# Patient Record
Sex: Female | Born: 1990 | Race: White | Hispanic: No | Marital: Married | Smoking: Former smoker
Health system: Southern US, Community
[De-identification: ages and names within clinical notes are randomized; demographics above are authoritative.]

## PROBLEM LIST (undated history)

## (undated) ENCOUNTER — Inpatient Hospital Stay: Payer: Self-pay

## (undated) DIAGNOSIS — R519 Headache, unspecified: Secondary | ICD-10-CM

## (undated) DIAGNOSIS — N946 Dysmenorrhea, unspecified: Secondary | ICD-10-CM

## (undated) DIAGNOSIS — F410 Panic disorder [episodic paroxysmal anxiety] without agoraphobia: Secondary | ICD-10-CM

## (undated) DIAGNOSIS — R51 Headache: Secondary | ICD-10-CM

## (undated) DIAGNOSIS — N926 Irregular menstruation, unspecified: Secondary | ICD-10-CM

## (undated) DIAGNOSIS — N6452 Nipple discharge: Secondary | ICD-10-CM

## (undated) DIAGNOSIS — L0591 Pilonidal cyst without abscess: Secondary | ICD-10-CM

## (undated) HISTORY — PX: TYMPANOSTOMY TUBE PLACEMENT: SHX32

## (undated) HISTORY — PX: PILONIDAL CYST EXCISION: SHX744

## (undated) HISTORY — DX: Nipple discharge: N64.52

## (undated) HISTORY — DX: Pilonidal cyst without abscess: L05.91

## (undated) HISTORY — DX: Irregular menstruation, unspecified: N92.6

## (undated) HISTORY — DX: Dysmenorrhea, unspecified: N94.6

---
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
## 2010-03-02 ENCOUNTER — Emergency Department: Payer: Self-pay | Admitting: Internal Medicine

## 2010-05-04 ENCOUNTER — Encounter: Payer: Self-pay | Admitting: Maternal and Fetal Medicine

## 2010-07-13 ENCOUNTER — Encounter: Payer: Self-pay | Admitting: Maternal & Fetal Medicine

## 2010-08-16 ENCOUNTER — Observation Stay: Payer: Self-pay | Admitting: Obstetrics and Gynecology

## 2010-09-02 ENCOUNTER — Observation Stay: Payer: Self-pay | Admitting: Obstetrics and Gynecology

## 2010-09-11 ENCOUNTER — Observation Stay: Payer: Self-pay | Admitting: Obstetrics and Gynecology

## 2010-09-21 ENCOUNTER — Observation Stay: Payer: Self-pay | Admitting: Obstetrics and Gynecology

## 2010-09-25 ENCOUNTER — Inpatient Hospital Stay: Payer: Self-pay | Admitting: Obstetrics and Gynecology

## 2011-01-01 ENCOUNTER — Emergency Department: Payer: Self-pay | Admitting: Emergency Medicine

## 2012-07-16 LAB — HM PAP SMEAR: HM Pap smear: NEGATIVE

## 2014-04-12 ENCOUNTER — Ambulatory Visit: Payer: Self-pay | Admitting: Obstetrics and Gynecology

## 2014-06-29 ENCOUNTER — Ambulatory Visit (INDEPENDENT_AMBULATORY_CARE_PROVIDER_SITE_OTHER): Payer: Managed Care, Other (non HMO) | Admitting: Obstetrics and Gynecology

## 2014-06-29 ENCOUNTER — Encounter: Payer: Self-pay | Admitting: Obstetrics and Gynecology

## 2014-06-29 VITALS — BP 102/70 | HR 67 | Ht 64.0 in | Wt 134.0 lb

## 2014-06-29 DIAGNOSIS — N898 Other specified noninflammatory disorders of vagina: Secondary | ICD-10-CM | POA: Diagnosis not present

## 2014-06-29 DIAGNOSIS — N644 Mastodynia: Secondary | ICD-10-CM

## 2014-06-29 MED ORDER — AZITHROMYCIN 500 MG PO TABS: 1000.0000 mg | ORAL_TABLET | Freq: Once | ORAL | Status: DC

## 2014-06-29 NOTE — Progress Notes (Signed)
Patient ID: Amanda Allen, female   DOB: 1990-02-06, 24 y.o.   MRN: [id number] SUBJECTIVE Pt here today for follow up on lump in left breast. Pt states that she has had some issues with her IUD. Pt states that when she first got her IUD she had no periods and for the last 3 months she has had heavy periods that last about a week. Pt states that she is also having pain with intercourse.   The patient has had her IUD for approximately 4 years.  Her cycles have become heavy and very irregular over the past 3 months.  Deep thrusting dyspareunia is noted.  Patient does not have perimenstrual low back ache.  She does not have a family history of endometriosis.  The patient's breast tenderness is left sided.  There is asymmetry of the breasts.  She has noted that her symptoms have been correlated with her IUD use.  Ultrasound in radiology was negative for abnormal findings.  OBJECTIVE Well-appearing white female in no acute distress. Neck supple without thyromegaly or adenopathy. Breasts notable for asymmetry with left larger than right; diffuse fibrocystic changes in the lateral quadrants of left breast are appreciated; left breast is 1/4 tender; no nipple discharge. Abdomen soft, nontender, without organomegaly. Pelvic: External genitalia normal BUS: Normal. Vagina: White yellow discharge; wet prep notable for moderate white blood cells, otherwise negative; KOH prep is negative. Cervix: Parous, without lesions; mild cervical motion tenderness present. Uterus: Midplane, normal size and shape, mildly tender. Rectal: External exam normal. Skin: No rash. Neuro: Normal.  ASSESSMENT: 1.  Mastodynia, uncertain etiology. 2.  Vaginal discharge, uncertain etiology, possibly related to IUD.  Plan: 1.  Zithromax 1 g by mouth 1 dose. 2.  Monitor symptoms.  Limit caffeine and chocolate intake. 3.  Discuss with husband.  Alternative forms of contraception with plan on removing the IUD in near  future.

## 2014-06-29 NOTE — Patient Instructions (Addendum)
1.  Zithromax 1 g by mouth 1 dose. 2.  Monitor symptoms.  Limit caffeine and chocolate intake. 3.  Discuss with husband.  Alternative forms of contraception with plan on removing the IUD in near future. 4.  Patient will follow up in several weeks for further management planning.

## 2014-07-01 ENCOUNTER — Ambulatory Visit: Payer: Self-pay | Admitting: Obstetrics and Gynecology

## 2014-07-06 ENCOUNTER — Ambulatory Visit (INDEPENDENT_AMBULATORY_CARE_PROVIDER_SITE_OTHER): Payer: Managed Care, Other (non HMO) | Admitting: Obstetrics and Gynecology

## 2014-07-06 ENCOUNTER — Encounter: Payer: Self-pay | Admitting: Obstetrics and Gynecology

## 2014-07-06 ENCOUNTER — Telehealth: Payer: Self-pay | Admitting: Obstetrics and Gynecology

## 2014-07-06 VITALS — BP 115/74 | HR 82 | Ht 64.0 in | Wt 132.1 lb

## 2014-07-06 DIAGNOSIS — N898 Other specified noninflammatory disorders of vagina: Secondary | ICD-10-CM

## 2014-07-06 DIAGNOSIS — N644 Mastodynia: Secondary | ICD-10-CM

## 2014-07-06 DIAGNOSIS — Z30011 Encounter for initial prescription of contraceptive pills: Secondary | ICD-10-CM

## 2014-07-06 DIAGNOSIS — Z30431 Encounter for routine checking of intrauterine contraceptive device: Secondary | ICD-10-CM

## 2014-07-06 MED ORDER — NORETHINDRONE ACET-ETHINYL EST 1-20 MG-MCG PO TABS: 1.0000 | ORAL_TABLET | Freq: Every day | ORAL | Status: DC

## 2014-07-06 NOTE — Telephone Encounter (Signed)
Has a question about Birth control rx this morning

## 2014-07-06 NOTE — Progress Notes (Signed)
GYN ENCOUNTER NOTE  Subjective:       Amanda Allen is a 24 y.o. No obstetric history on file. female is here for gynecologic evaluation of the following issues:  1. IUD removal. 2.  OCP start. 3.  Vaginal discharge. 4.  Mastodynia   Gynecologic History Patient's last menstrual period was 05/31/2014 (exact date). Contraception: IUD   Obstetric History OB History  No data available    Past Medical History  Diagnosis Date  . Pilonidal cyst   . Irregular menses   . Nipple discharge in female     occasional  . Dysmenorrhea     Past Surgical History  Procedure Laterality Date  . Pilonidal cyst excision      No current outpatient prescriptions on file prior to visit.   No current facility-administered medications on file prior to visit.    No Known Allergies  History   Social History  . Marital Status: Single    Spouse Name: N/A  . Number of Children: N/A  . Years of Education: N/A   Occupational History  . Not on file.   Social History Main Topics  . Smoking status: Current Every Day Smoker -- 0.25 packs/day for 2 years    Types: Cigarettes  . Smokeless tobacco: Never Used  . Alcohol Use: No  . Drug Use: No  . Sexual Activity: Yes    Birth Control/ Protection: IUD   Other Topics Concern  . Not on file   Social History Narrative    Family History  Problem Relation Age of Onset  . Cancer Paternal Grandmother     pgm-sister  . Hyperlipidemia Mother   . Cancer Maternal Grandfather     prostate    The following portions of the patient's history were reviewed and updated as appropriate: allergies, current medications, past family history, past medical history, past social history, past surgical history and problem list.  Review of Systems Review of Systems - Breast ROS: negative for - new or changing breast lumps, nipple discharge or mastodynia Review of Systems - General ROS: negative for - chills, fatigue, fever, hot flashes, malaise or  night sweats Hematological and Lymphatic ROS: negative for - bleeding problems or swollen lymph nodes Gastrointestinal ROS: negative for - abdominal pain, blood in stools, change in bowel habits and nausea/vomiting Musculoskeletal ROS: negative for - joint pain, muscle pain or muscular weakness Genito-Urinary ROS: negative for - change in menstrual cycle, dysmenorrhea, dyspareunia, dysuria, genital discharge, genital ulcers, hematuria, incontinence, irregular/heavy menses, nocturia or pelvic pain  Objective:   BP 115/74 mmHg  Pulse 82  Ht  (1.626 m)  Wt 132 lb 1 oz (59.903 kg)  BMI 22.66 kg/m2  LMP 05/31/2014 (Exact Date) CONSTITUTIONAL: Well-developed, well-nourished female in no acute distress.  HENT:  Normocephalic, atraumatic.  NECK: Normal range of motion, supple, no masses.  Normal thyroid.  SKIN: Skin is warm and dry. No rash noted. Not diaphoretic. No erythema. No pallor. NEUROLGIC: Alert and oriented to person, place, and time. PSYCHIATRIC: Normal mood and affect. Normal behavior. Normal judgment and thought content. CARDIOVASCULAR:Not Examined RESPIRATORY: Not Examined BREASTS: Not Examined ABDOMEN: Soft, non distended; Non tender.  No Organomegaly. PELVIC:  External Genitalia: Normal  BUS: Normal  Vagina: Normal  Cervix: Normal; IUD removed  RV: Normal   Bladder: Nontender      Assessment:  1.  Contraception-Mirena IUD removed 2.  Patient desires starting birth control pills. 3.  Vaginal discharge, resolved, status post treatment with Zithromax 1  g by mouth 4.  Breast tenderness-resolved  Plan:  1.  Mirena IUD removed. 2.  Use backup method of contraception for 4 weeks. 3.  Start Microgestin Fe 1/20 OCPs. 4.  RTC in 3 months for her annual exam.

## 2014-07-06 NOTE — Telephone Encounter (Signed)
Pt picked up her ocp which was only 21 day pack. Advised she will take for 21 days then have her period for the next 7 days. After 7 days she will start her new pack.

## 2014-07-06 NOTE — Patient Instructions (Addendum)
1.  Use backup birth Control for 2 weeks. 2.  Start Microgestin OCPs. 3.  RTC for annual exam in 3 months.

## 2014-07-27 ENCOUNTER — Encounter: Payer: Self-pay | Admitting: Obstetrics and Gynecology

## 2014-08-23 ENCOUNTER — Telehealth: Payer: Self-pay | Admitting: Obstetrics and Gynecology

## 2014-08-23 NOTE — Telephone Encounter (Signed)
Just started [facility] June 14 and had a normal period on the first month. Then she started again before she finished her pills. She finished her last pill last night and needs to know if she should start up her next pack or go the week off like narmal.

## 2014-08-24 NOTE — Telephone Encounter (Signed)
Pt advised to take bcp as prescribed and to expect to have irregular menses, vaginal spotting from 3-6 months. The ocp's should regulate her cycles.

## 2014-09-20 ENCOUNTER — Telehealth: Payer: Self-pay | Admitting: Obstetrics and Gynecology

## 2014-09-20 NOTE — Telephone Encounter (Signed)
ON Amanda Allen AND SHE IS HAVING IRREGULAR PERIODS, NEG PREG TEST, BREAST TENDERNESS, FATIGUE, NAUSEA, AND SHE HAS HAD AN INCREASE IN SALIVA, AND SHE WANTED TO KNOW WHAT SHE NEEDS TO DO, SHE THINKS SHE IS ON HER PERIOD NOW AND SHE SAYS ITS REALLY PAINFUL. PT WOULD LIKE A CALL BACK ON WHAT SHE SHOULD DO. SHE STATED SHE WAS IN THE FETAL POSITION LAST NIGHT DUE TO THE PAIN. PT DOES HAVE AN APPT COMING UP IN A COUPLE OF WEEKS, SHE WANTED TO KNOW IF THERE WAS ANYTHING TOMORROW I TOLD HER NO, BUT I WOULD SEND THIS TO YOU FOR YOU TO MAKE THE CALL ON IF SHE NEEDS TO COME IN.

## 2014-09-21 NOTE — Telephone Encounter (Signed)
Pt has been on junel 1/20 since June- She is having irregular cycles. Really bad cramps the last 3 days. 2 neg upt. Taking pills same time qd. Advised it may take up to 6 months to regulate cycle. Her bleeding was heavy at times. She is taking Pamprin and asa with slight relief from cramps. Advised to take 800 ibup q 8. If bleeding gets heavy soaking a pad q 30 min she will need to be seen prior to her 2 week f/u.

## 2014-10-06 ENCOUNTER — Ambulatory Visit (INDEPENDENT_AMBULATORY_CARE_PROVIDER_SITE_OTHER): Payer: Managed Care, Other (non HMO) | Admitting: Obstetrics and Gynecology

## 2014-10-06 ENCOUNTER — Encounter: Payer: Self-pay | Admitting: Obstetrics and Gynecology

## 2014-10-06 VITALS — BP 108/74 | HR 105 | Ht 64.0 in | Wt 133.3 lb

## 2014-10-06 DIAGNOSIS — N946 Dysmenorrhea, unspecified: Secondary | ICD-10-CM | POA: Diagnosis not present

## 2014-10-06 DIAGNOSIS — N941 Dyspareunia: Secondary | ICD-10-CM | POA: Diagnosis not present

## 2014-10-06 DIAGNOSIS — N939 Abnormal uterine and vaginal bleeding, unspecified: Secondary | ICD-10-CM | POA: Diagnosis not present

## 2014-10-06 DIAGNOSIS — Z01419 Encounter for gynecological examination (general) (routine) without abnormal findings: Secondary | ICD-10-CM | POA: Diagnosis not present

## 2014-10-06 DIAGNOSIS — IMO0002 Reserved for concepts with insufficient information to code with codable children: Secondary | ICD-10-CM

## 2014-10-06 DIAGNOSIS — Z720 Tobacco use: Secondary | ICD-10-CM

## 2014-10-06 MED ORDER — NORETHIN ACE-ETH ESTRAD-FE 1.5-30 MG-MCG PO TABS: 1.0000 | ORAL_TABLET | Freq: Every day | ORAL | Status: DC

## 2014-10-06 NOTE — Addendum Note (Signed)
Addended by: Marchelle Folks on: 10/06/2014 11:47 AM   Modules accepted: Orders

## 2014-10-06 NOTE — Patient Instructions (Addendum)
1.  Pap smear not done because of menses. 2.  Return in 3 months for Pap smear and follow-up on cycles. 3.  Change birth control pill from Junel1/20 to  JuneL 1.5/30. 4.  Take Advil 800 mg 3 times a day for Aleve 2 tablets twice a day for severe cramps. 5.  May take Tylenol as needed as well to help with cramps. 6.  Consider taking Wellbutrin for smoking cessation.  Call us if you would like to start the medication after talking to her mother,, and checking for insurance coverage. 7.  Return in one year for annual exam.

## 2014-10-06 NOTE — Progress Notes (Signed)
Patient ID: Amanda Allen, female   DOB: [birth year], 24 y.o.   MRN: [id number] [facility]NU[facility] GYN  ENCOUNTER NOTE  Subjective:       Amanda Allen is a 24 y.o. No obstetric history on file. female here for a routine annual gynecologic exam.  Current complaints: 1.  Irregular bleeding on junel 1/20 - cramps and fatigued; over past couple months leading has been heavy and associated with severe cramps; no intermenstrual bleeding. 2.  New onset deep thrusting dyspareunia. 3.  Tobacco user-interested in quitting   Gynecologic History Patient's last menstrual period was 10/03/2014. Contraception: OCP (estrogen/progesterone) Last Pap: 07/21/2013 neg. Results were: normal Last mammogram: n.a. Results were: n/a History of Mirena IUD use, not tolerated due to breast changes, which resolved after discontinuation.  Obstetric History Para 1001  Past Medical History  Diagnosis Date  . Pilonidal cyst   . Irregular menses   . Nipple discharge in female     occasional  . Dysmenorrhea     Past Surgical History  Procedure Laterality Date  . Pilonidal cyst excision      Current Outpatient Prescriptions on File Prior to Visit  Medication Sig Dispense Refill  . norethindrone-ethinyl estradiol (MICROGESTIN) 1-20 MG-MCG tablet Take 1 tablet by mouth daily. 1 Package 11   No current facility-administered medications on file prior to visit.    No Known Allergies  Social History   Social History  . Marital Status: Single    Spouse Name: N/A  . Number of Children: N/A  . Years of Education: N/A   Occupational History  . Not on file.   Social History Main Topics  . Smoking status: Current Every Day Smoker -- 0.25 packs/day for 2 years    Types: Cigarettes  . Smokeless tobacco: Never Used  . Alcohol Use: No  . Drug Use: No  . Sexual Activity: Yes    Birth Control/ Protection: Pill   Other Topics Concern  . Not on file   Social History Narrative    Family  History  Problem Relation Age of Onset  . Cancer Paternal Grandmother     pgm-sister  . Hyperlipidemia Mother   . Cancer Maternal Grandfather     prostate    The following portions of the patient's history were reviewed and updated as appropriate: allergies, current medications, past family history, past medical history, past social history, past surgical history and problem list.  Review of Systems ROS Review of Systems - General ROS: negative for - chills, fatigue, fever, hot flashes, night sweats, weight gain or weight loss Psychological ROS: negative for - anxiety, decreased libido, depression, mood swings, physical abuse or sexual abuse Ophthalmic ROS: negative for - blurry vision, eye pain or loss of vision ENT ROS: negative for - headaches, hearing change, visual changes or vocal changes Allergy and Immunology ROS: negative for - hives, itchy/watery eyes or seasonal allergies Hematological and Lymphatic ROS: negative for - bleeding problems, bruising, swollen lymph nodes or weight loss Endocrine ROS: negative for - galactorrhea, hair pattern changes, hot flashes, malaise/lethargy, mood swings, palpitations, polydipsia/polyuria, skin changes, temperature intolerance or unexpected weight changes Breast ROS: negative for - new or changing breast lumps or nipple discharge Respiratory ROS: negative for - cough or shortness of breath Cardiovascular ROS: negative for - chest pain, irregular heartbeat, palpitations or shortness of breath Gastrointestinal ROS: no abdominal pain, change in bowel habits, or black or bloody stools Genito-Urinary ROS: no dysuria, trouble voiding, or hematuria Musculoskeletal ROS: negative  for - joint pain or joint stiffness Neurological ROS: negative for - bowel and bladder control changes Dermatological ROS: negative for rash and skin lesion changes   Objective:   BP 108/74 mmHg  Pulse 105  Ht  (1.626 m)  Wt 133 lb 4.8 oz (60.464 kg)  BMI 22.87  kg/m2  LMP 10/03/2014 CONSTITUTIONAL: Well-developed, well-nourished female in no acute distress.  PSYCHIATRIC: Normal mood and affect. Normal behavior. Normal judgment and thought content. NEUROLGIC: Alert and oriented to person, place, and time. Normal muscle tone coordination. No cranial nerve deficit noted. HENT:  Normocephalic, atraumatic, External right and left ear normal. Oropharynx is clear and moist EYES: Conjunctivae and EOM are normal. Pupils are equal, round, and reactive to light. No scleral icterus.  NECK: Normal range of motion, supple, no masses.  Normal thyroid.  SKIN: Skin is warm and dry. No rash noted. Not diaphoretic. No erythema. No pallor. CARDIOVASCULAR: Normal heart rate noted, regular rhythm, no murmur. RESPIRATORY: Clear to auscultation bilaterally. Effort and breath sounds normal, no problems with respiration noted. BREASTS: Symmetric in size. No masses, skin changes, nipple drainage, or lymphadenopathy. ABDOMEN: Soft, normal bowel sounds, no distention noted.  No tenderness, rebound or guarding.  BLADDER: Normal PELVIC:  External Genitalia: Normal  BUS: Normal  Vagina: Normal; menstrual blood in the vault, too much to do Pap smear  Cervix: Normal; mild cervical motion tenderness  Uterus: Normal; retroverted, mobile, 1/4 tender, not enlarged  Adnexa: Normal  RV: External Exam NormaI  MUSCULOSKELETAL: Normal range of motion. No tenderness.  No cyanosis, clubbing, or edema.  2+ distal pulses. LYMPHATIC: No Axillary, Supraclavicular, or Inguinal Adenopathy.    Assessment:   Annual gynecologic examination 24 y.o. Contraception: OCP (estrogen/progesterone) Normal BMI Abnormal uterine bleeding on birth control pills (20 g pill). New-onset deep thrusting dyspareunia. Tobacco user-desiring to quit  Plan:  Pap: pap w reflex ct/ng(Will do at follow-up visit in 3 months) Mammogram: Not Indicated Stool Guaiac Testing:  Not Indicated Labs: all normal last year  will repeat 2017 Routine preventative health maintenance measures emphasized: Exercise/Diet/Weight control, Tobacco Warnings and Alcohol/Substance use risks Will think about Wellbutrin use for smoking cessation and will contact us if she desires medication. Return in 3 months for Pap smear and follow-up on oral contraceptives for control of dysmenorrhea and menorrhagia. Change from 20 g to 30 g OCP (Junel 1.5/30) Return to Clinic - [address] [place], [place]  Herold Harms, MD

## 2014-11-29 ENCOUNTER — Ambulatory Visit (INDEPENDENT_AMBULATORY_CARE_PROVIDER_SITE_OTHER): Payer: Managed Care, Other (non HMO) | Admitting: Family Medicine

## 2014-11-29 ENCOUNTER — Encounter: Payer: Self-pay | Admitting: Family Medicine

## 2014-11-29 VITALS — BP 100/64 | HR 78 | Ht 65.0 in | Wt 133.0 lb

## 2014-11-29 DIAGNOSIS — F17210 Nicotine dependence, cigarettes, uncomplicated: Secondary | ICD-10-CM

## 2014-11-29 DIAGNOSIS — Z7189 Other specified counseling: Secondary | ICD-10-CM | POA: Diagnosis not present

## 2014-11-29 DIAGNOSIS — H6502 Acute serous otitis media, left ear: Secondary | ICD-10-CM | POA: Diagnosis not present

## 2014-11-29 DIAGNOSIS — J40 Bronchitis, not specified as acute or chronic: Secondary | ICD-10-CM | POA: Diagnosis not present

## 2014-11-29 DIAGNOSIS — Z7689 Persons encountering health services in other specified circumstances: Secondary | ICD-10-CM

## 2014-11-29 MED ORDER — ALBUTEROL SULFATE HFA 108 (90 BASE) MCG/ACT IN AERS: 2.0000 | INHALATION_SPRAY | Freq: Four times a day (QID) | RESPIRATORY_TRACT | Status: DC | PRN

## 2014-11-29 MED ORDER — AMOXICILLIN 500 MG PO CAPS: 500.0000 mg | ORAL_CAPSULE | Freq: Three times a day (TID) | ORAL | Status: DC

## 2014-11-29 MED ORDER — GUAIFENESIN-CODEINE 100-10 MG/5ML PO SOLN: 5.0000 mL | Freq: Three times a day (TID) | ORAL | Status: DC | PRN

## 2014-11-29 NOTE — Progress Notes (Signed)
Name: Amanda Allen   MRN: [id number]    DOB: [birth year]   Date:11/29/2014       Progress Note  Subjective  Chief Complaint  Chief Complaint  Patient presents with  . Bronchitis    cong, SOB, wheezing, bilateral ear pain    Cough This is a new problem. The current episode started in the past 7 days. The problem has been waxing and waning. The problem occurs every few minutes. The cough is productive of purulent sputum. Associated symptoms include ear congestion, ear pain, a fever, headaches, nasal congestion, postnasal drip, rhinorrhea, shortness of breath, sweats and wheezing. Pertinent negatives include no chest pain, chills, heartburn, hemoptysis, myalgias, rash, sore throat or weight loss. The symptoms are aggravated by cold air and pollens. Risk factors for lung disease include smoking/tobacco exposure. She has tried prescription cough suppressant for the symptoms. The treatment provided no relief. There is no history of asthma, bronchitis, COPD or environmental allergies.    No problem-specific assessment & plan notes found for this encounter.   Past Medical History  Diagnosis Date  . Pilonidal cyst   . Irregular menses   . Nipple discharge in female     occasional  . Dysmenorrhea     Past Surgical History  Procedure Laterality Date  . Pilonidal cyst excision      Family History  Problem Relation Age of Onset  . Cancer Paternal Grandmother     pgm-sister  . Hyperlipidemia Mother   . Cancer Maternal Grandfather     prostate    Social History   Social History  . Marital Status: Single    Spouse Name: N/A  . Number of Children: N/A  . Years of Education: N/A   Occupational History  . Not on file.   Social History Main Topics  . Smoking status: Current Every Day Smoker -- 0.25 packs/day for 2 years    Types: Cigarettes  . Smokeless tobacco: Never Used  . Alcohol Use: No  . Drug Use: No  . Sexual Activity: Yes    Birth Control/ Protection: Pill    Other Topics Concern  . Not on file   Social History Narrative    No Known Allergies   Review of Systems  Constitutional: Positive for fever. Negative for chills, weight loss and malaise/fatigue.  HENT: Positive for ear pain, postnasal drip and rhinorrhea. Negative for ear discharge and sore throat.   Eyes: Negative for blurred vision.  Respiratory: Positive for cough, shortness of breath and wheezing. Negative for hemoptysis and sputum production.   Cardiovascular: Negative for chest pain, palpitations and leg swelling.  Gastrointestinal: Negative for heartburn, nausea, abdominal pain, diarrhea, constipation, blood in stool and melena.  Genitourinary: Negative for dysuria, urgency, frequency and hematuria.  Musculoskeletal: Negative for myalgias, back pain, joint pain and neck pain.  Skin: Negative for rash.  Neurological: Positive for headaches. Negative for dizziness, tingling, sensory change and focal weakness.  Endo/Heme/Allergies: Negative for environmental allergies and polydipsia. Does not bruise/bleed easily.  Psychiatric/Behavioral: Negative for depression and suicidal ideas. The patient is not nervous/anxious and does not have insomnia.      Objective  Filed Vitals:   11/29/14 1143  BP: 100/64  Pulse: 78  Height: 5\' 5"  (1.651 m)  Weight: 133 lb (60.328 kg)  SpO2: 99%    Physical Exam  Constitutional: She is well-developed, well-nourished, and in no distress. No distress.  HENT:  Head: Normocephalic and atraumatic.  Right Ear: External ear normal. Tympanic membrane is  retracted.  Left Ear: External ear normal. A middle ear effusion is present.  Nose: Nose normal.  Mouth/Throat: Oropharynx is clear and moist.  Eyes: Conjunctivae and EOM are normal. Pupils are equal, round, and reactive to light. Right eye exhibits no discharge. Left eye exhibits no discharge.  Neck: Normal range of motion. Neck supple. No JVD present. No thyromegaly present.  Cardiovascular:  Normal rate, regular rhythm, normal heart sounds and intact distal pulses.  Exam reveals no gallop and no friction rub.   No murmur heard. Pulmonary/Chest: Effort normal and breath sounds normal.  Abdominal: Soft. Bowel sounds are normal. She exhibits no mass. There is no tenderness. There is no guarding.  Musculoskeletal: Normal range of motion. She exhibits no edema.  Lymphadenopathy:    She has no cervical adenopathy.  Neurological: She is alert. She has normal reflexes.  Skin: Skin is warm and dry. She is not diaphoretic.  Psychiatric: Mood and affect normal.      Assessment & Plan  Problem List Items Addressed This Visit    None    Visit Diagnoses    Bronchitis    -  Primary    Relevant Medications    amoxicillin (AMOXIL) 500 MG capsule    albuterol (PROVENTIL HFA;VENTOLIN HFA) 108 (90 BASE) MCG/ACT inhaler    guaiFENesin-codeine 100-10 MG/5ML syrup    Cigarette nicotine dependence without complication        Relevant Medications    amoxicillin (AMOXIL) 500 MG capsule    albuterol (PROVENTIL HFA;VENTOLIN HFA) 108 (90 BASE) MCG/ACT inhaler    guaiFENesin-codeine 100-10 MG/5ML syrup    Acute serous otitis media of left ear, recurrence not specified        Relevant Medications    amoxicillin (AMOXIL) 500 MG capsule         Dr. Hay[facility]  11/29/2014

## 2014-11-29 NOTE — Patient Instructions (Signed)
Smoking: Ways to Quit   Know why you want to quit.   When you quit smoking, your body gets to work repairing damaged tissues. Here are some of the health benefits:   You stop the destruction of your lungs.  Your lungs are better able to fight colds, and other respiratory infections.  You decrease your risk of cancer, heart disease, strokes, and circulation problems.   In addition, when you quit you will:   Feel more in control of your life.  Have better smelling hair, breath, clothes, home, and car.  Have more stamina for activities.  Save money.  Protect your family and friends from the dangers of secondhand smoke.   Smoking is an addictive habit. Most former smokers make several attempts to quit before they finally succeed. So, never say, "I can't." Just keep trying.   Set a quit date.   Set a date for when you will stop smoking. Don't buy cigarettes to carry you beyond your last day. Tell your family and friends you plan to quit, and ask for their support and encouragement. Ask them not to offer you cigarettes.  Make a plan.   5 Days Before Your Quit Date   Think about your reasons for quitting.  Tell your friends and family you are planning to quit.  Stop buying cigarettes.   4 Days Before Your Quit Date   Pay attention to when and why you smoke.  Think of other things to hold in your hand instead of a cigarette.  Think of habits or routines to change.   3 Days Before Your Quit Date   Plan what you will do with the extra money when you stop buying cigarettes.  Think of whom you can reach out to when you need help.   2 Days Before Your Quit Date   Consider buying nonprescription nicotine patches or nicotine gum. Or see your health care provider to get a prescription for the nicotine inhaler, nasal spray, or other medicine that can help.     1 Day Before Your Quit Date   Put away lighters and ashtrays.  Throw away all cigarettes and matches - no emergency stashes  are allowed!  Clean your clothes to get rid of the smell of cigarette smoke.   Quit Day   Keep very busy.  Remind family and friends that this is your quit day.  Stay away from alcohol.  Stay away from places where you used to smoke and people you used to smoke with.  Give yourself a treat or do something else special.   Commit to staying quit.   Make sure that all your cigarettes and ashtrays are thrown away.   If you keep cigarettes or ashtrays around, sooner or later you'll break down and smoke one, then another, then another, and so on. Throw them away. Make it hard to start again.   Because you are used to having something in your mouth, you may want to chew gum as a substitute for smoking. Or munch on carrots or celery.   Spend time with nonsmokers rather than with smokers.   Think of yourself as a nonsmoker. Tell other people that you are a nonsmoker (for example, in restaurants). Stay away from places where there are a lot of smokers, such as bars. Avoid spending time with smokers. You can't tell others not to smoke, but you don't have to sit with them while they do. Plan on walking away from cigarette smoke. Spend time   with nonsmokers and sit in the nonsmoking section of restaurants.   Be prepared for relapse or difficult situations.   Most people who go back to smoking cigarettes do so within the first 3 months after quitting. Many people try 5 or more times before they successfully quit. Avoid drinking alcohol, because it lowers your chances of success. Don't be distracted by the weight you may gain after quitting. Smokers usually do not gain more than 10 pounds when they stop smoking. Learn new ways to improve your mood and overcome depression.   Start an exercise program.   As you become more fit, you will not want the nicotine effects in your body. Regular exercise will also help keep you from gaining weight.   Keep your hands busy.   You may not know what to do with  your hands for a while. Try reading, knitting, needlework, pottery, drawing, making a plastic model, or doing a jigsaw puzzle. Join special interest groups that keep you involved in your hobby.      Take on new activities.   Change your routine. Take on new activities that don't include smoking. Join an exercise group and work out regularly. Sign up for an evening class or a join a study group at your place of worship. Go on more outings with your family or friends. Learn ways to relax and manage stress.   Join a quit-smoking program if it helps.   Some people do better in groups, or with a set of instructions to follow. That's fine, too. Remember, the goal is to quit smoking. It doesn't matter how you do it.   Consider using nicotine replacement therapy.   Nicotine is the drug that is in tobacco. You can use nicotine patches or gum, available without a prescription at your local pharmacy, to help you quit smoking. Quitting smoking is a two-step process. It includes breaking the physical addiction to nicotine and breaking the smoking habit. Nicotine replacement helps take care of the nicotine addiction so that you can focus on breaking the habit.   Your health care provider can prescribe nicotine substitutes that can almost double your chances of quitting for good. They are:   Zyban (bupropion HCL)  nicotine inhaler  nicotine lozenge  nicotine nasal spray  nicotine patch.   None of these treatments is a miracle cure. Quitting can be hard work, but you can learn to live without cigarettes in your daily life.  

## 2015-01-05 ENCOUNTER — Encounter: Payer: Self-pay | Admitting: Obstetrics and Gynecology

## 2015-01-05 ENCOUNTER — Ambulatory Visit (INDEPENDENT_AMBULATORY_CARE_PROVIDER_SITE_OTHER): Payer: Managed Care, Other (non HMO) | Admitting: Obstetrics and Gynecology

## 2015-01-05 VITALS — BP 109/71 | HR 77 | Ht 64.0 in | Wt 137.8 lb

## 2015-01-05 DIAGNOSIS — Z113 Encounter for screening for infections with a predominantly sexual mode of transmission: Secondary | ICD-10-CM

## 2015-01-05 DIAGNOSIS — N939 Abnormal uterine and vaginal bleeding, unspecified: Secondary | ICD-10-CM | POA: Diagnosis not present

## 2015-01-05 DIAGNOSIS — N946 Dysmenorrhea, unspecified: Secondary | ICD-10-CM | POA: Diagnosis not present

## 2015-01-05 DIAGNOSIS — Z124 Encounter for screening for malignant neoplasm of cervix: Secondary | ICD-10-CM | POA: Diagnosis not present

## 2015-01-05 NOTE — Progress Notes (Signed)
Patient ID: Amanda SproutCarly Ann Konzelmann, female   DOB: 1990/05/08, 24 y.o.   MRN: [id number] 3 month f/u on painful heavy periods junel 1.5/30 - not helping Needs pap  Chief complaint: 1.  Abnormal uterine bleeding. 2.  Dysmenorrhea. 3.  Pap smear  Patient presents for Pap smear testing and follow-up on switch in oral contraceptives.   ABNORMAL UTERINE BLEEDING:Patient was experiencing abnormal uterine bleeding on Junel 1/20 OCP.  Over the past 3 months she has been taking Junel 1.5/30 OCP; abnormal uterine bleeding persists.  She has not skipped any of the pills.  She is taking the pills at the same time every day. DYSMENORRHEA: Patient continues to have significantly painful menses as well as discomfort with intercourse.  Past medical history, Past surgical history, medications, allergies, are reviewed.  Review of systems: Per HPI.  OBJECTIVE: BP 109/71 mmHg  Pulse 77  Ht 5\' 4"  (1.626 m)  Wt 137 lb 12.8 oz (62.506 kg)  BMI 23.64 kg/m2  LMP 12/29/2014 (Exact Date) General white female in no acute distress. Abdomen: Soft, nontender, without organomegaly. Pelvic exam: External genitalia-normal BUS-normal Vagina-normal. Cervix-parous; 1/4.  Cervical motion tenderness. Uterus-midplane to retroverted, mobile, 1/4 tender. Adnexa-nonpalpable, nontender. Rectovaginal-normal.  External exam  ASSESSMENT: 1.  Abnormal uterine bleeding on OCPs, despite change to a higher dosage estrogen containing pill. 2.  Dysmenorrhea, persists 3.  Pap smear is completed today.  PLAN: 1.  Continue with Junel 1.5/30 OCPs. 2.  Scheduled for laparoscopy with peritoneal biopsies to rule out endometriosis.. 3.  Pap smear. 4.  Return for preop appointment  A total of 15 minutes were spent face-to-face with the patient during this encounter and over half of that time dealt with counseling and coordination of care.  Herold HarmsMartin A [place], MD  Note: This dictation was prepared with Dragon dictation along with  smaller phrase technology. Any transcriptional errors that result from this process are unintentional.

## 2015-01-05 NOTE — Patient Instructions (Signed)
1.  Pap smear is done today. 2.  Laparoscopy with peritoneal biopsies to rule out endometriosis is scheduled today. 3.  Return for preop appointment prior to surgery. 4.  Continue with Junel 1.5/30 OCPs

## 2015-01-09 LAB — PAP IG, CT-NG, RFX HPV ASCU: PAP SMEAR COMMENT: 0

## 2015-01-20 ENCOUNTER — Other Ambulatory Visit: Payer: Self-pay

## 2015-01-26 ENCOUNTER — Encounter: Payer: Self-pay | Admitting: Obstetrics and Gynecology

## 2015-01-26 ENCOUNTER — Ambulatory Visit (INDEPENDENT_AMBULATORY_CARE_PROVIDER_SITE_OTHER): Payer: Managed Care, Other (non HMO) | Admitting: Obstetrics and Gynecology

## 2015-01-26 VITALS — BP 98/61 | HR 86 | Ht 64.0 in | Wt 135.8 lb

## 2015-01-26 DIAGNOSIS — Z01818 Encounter for other preprocedural examination: Secondary | ICD-10-CM

## 2015-01-26 DIAGNOSIS — R102 Pelvic and perineal pain: Secondary | ICD-10-CM

## 2015-01-26 DIAGNOSIS — N946 Dysmenorrhea, unspecified: Secondary | ICD-10-CM

## 2015-01-26 DIAGNOSIS — N941 Unspecified dyspareunia: Secondary | ICD-10-CM

## 2015-01-26 NOTE — H&P (Signed)
Subjective: HISTORY AND PHYSICAL-PREOPERATIVE    Patient is a 25 y.o. Para 1001 .female scheduled for Laparoscopy with peritoneal biopsies on 01/31/2015. Indications for procedure are Dysmenorrhea and pelvic pain(Deep thrusting dyspareunia).    Pertinent Gynecological History: Menses: Heavy, regular Contraception: Junel 1.5/30 Long history of dysmenorrhea and menorrhagia suboptimally controlled on birth control pills   Menstrual History: Menarche age: Not applicable  Patient's last menstrual period was 12/29/2014 (exact date).    Past Medical History  Diagnosis Date  . Pilonidal cyst   . Irregular menses   . Nipple discharge in female     occasional  . Dysmenorrhea     Past Surgical History  Procedure Laterality Date  . Pilonidal cyst excision     Past OB history: Para 1001  Social History   Social History  . Marital Status: Single    Spouse Name: N/A  . Number of Children: N/A  . Years of Education: N/A   Social History Main Topics  . Smoking status: Current Every Day Smoker -- 0.25 packs/day for 2 years    Types: Cigarettes  . Smokeless tobacco: Never Used  . Alcohol Use: No  . Drug Use: No  . Sexual Activity: Yes    Birth Control/ Protection: Pill   Other Topics Concern  . None   Social History Narrative    Family History  Problem Relation Age of Onset  . Cancer Paternal Grandmother     pgm-sister  . Hyperlipidemia Mother   . Cancer Maternal Grandfather     prostate     (Not in a hospital admission)  No Known Allergies  Review of Systems Constitutional: No recent fever/chills/sweats Respiratory: No recent cough/bronchitis Cardiovascular: No chest pain Gastrointestinal: No recent nausea/vomiting/diarrhea Genitourinary: No UTI symptoms Hematologic/lymphatic:No history of coagulopathy or recent blood thinner use    Objective:    BP 98/61 mmHg  Pulse 86  Ht 5\' 4"  (1.626 m)  Wt 135 lb 12.8 oz (61.598 kg)  BMI 23.30 kg/m2  LMP 12/29/2014  (Exact Date)  General:   Normal  Skin:   normal  HEENT:  Normal  Neck:  Supple without Adenopathy or Thyromegaly  Lungs:   Heart:              Breasts:   Abdomen:  Pelvis:  M/S   Extremeties:  Neuro:    clear to auscultation bilaterally   Normal without murmur   Not Examined   soft, non-tender; bowel sounds normal; no masses,  no organomegaly   Pelvic exam: External genitalia-normal BUS-normal Vagina-normal. Cervix-parous; 1/4. Cervical motion tenderness. Uterus-midplane to retroverted, mobile, 1/4 tender. Adnexa-nonpalpable, nontender. Rectovaginal-normal. External exam   No CVAT  Warm/Dry   Normal          Assessment:    1.  Dysmenorrhea. 2.  Pelvic pain, refractory to OCP use   Plan:  Laparoscopy with peritoneal biopsies to rule out endometriosis  Preoperative counseling: The patient is to undergo laparoscopy with peritoneal biopsies on 01/31/2015.  She is understanding of the planned procedure and is aware of and is accepting of all surgical risks which include but are not limited to, leading, infection, pelvic organ injury with need for repair, blood clot disorders, anesthesia risks, etc.  All questions have been answered.  Informed consent is given.  Patient is ready and willing to proceed with surgery as scheduled.  Herold HarmsMartin A Defrancesco, MD  Note: This dictation was prepared with Dragon dictation along with smaller phrase technology. Any transcriptional errors that result from  this process are unintentional.

## 2015-01-26 NOTE — Progress Notes (Signed)
Subjective:    Patient is a 25 y.o. Para 1001 .female scheduled for Laparoscopy with peritoneal biopsies on 01/31/2015. Indications for procedure are Dysmenorrhea and pelvic pain(Deep thrusting dyspareunia).    Pertinent Gynecological History: Menses: Heavy, regular Contraception: Junel 1.5/30 Long history of dysmenorrhea and menorrhagia suboptimally controlled on birth control pills   Menstrual History: Menarche age: Not applicable  Patient's last menstrual period was 12/29/2014 (exact date).    Past Medical History  Diagnosis Date  . Pilonidal cyst   . Irregular menses   . Nipple discharge in female     occasional  . Dysmenorrhea     Past Surgical History  Procedure Laterality Date  . Pilonidal cyst excision     Past OB history: Para 1001  Social History   Social History  . Marital Status: Single    Spouse Name: N/A  . Number of Children: N/A  . Years of Education: N/A   Social History Main Topics  . Smoking status: Current Every Day Smoker -- 0.25 packs/day for 2 years    Types: Cigarettes  . Smokeless tobacco: Never Used  . Alcohol Use: No  . Drug Use: No  . Sexual Activity: Yes    Birth Control/ Protection: Pill   Other Topics Concern  . None   Social History Narrative    Family History  Problem Relation Age of Onset  . Cancer Paternal Grandmother     pgm-sister  . Hyperlipidemia Mother   . Cancer Maternal Grandfather     prostate     (Not in a hospital admission)  No Known Allergies  Review of Systems Constitutional: No recent fever/chills/sweats Respiratory: No recent cough/bronchitis Cardiovascular: No chest pain Gastrointestinal: No recent nausea/vomiting/diarrhea Genitourinary: No UTI symptoms Hematologic/lymphatic:No history of coagulopathy or recent blood thinner use    Objective:    BP 98/61 mmHg  Pulse 86  Ht 5\' 4"  (1.626 m)  Wt 135 lb 12.8 oz (61.598 kg)  BMI 23.30 kg/m2  LMP 12/29/2014 (Exact Date)  General:   Normal   Skin:   normal  HEENT:  Normal  Neck:  Supple without Adenopathy or Thyromegaly  Lungs:   Heart:              Breasts:   Abdomen:  Pelvis:  M/S   Extremeties:  Neuro:    clear to auscultation bilaterally   Normal without murmur   Not Examined   soft, non-tender; bowel sounds normal; no masses,  no organomegaly   Pelvic exam: External genitalia-normal BUS-normal Vagina-normal. Cervix-parous; 1/4. Cervical motion tenderness. Uterus-midplane to retroverted, mobile, 1/4 tender. Adnexa-nonpalpable, nontender. Rectovaginal-normal. External exam   No CVAT  Warm/Dry   Normal          Assessment:    1.  Dysmenorrhea. 2.  Pelvic pain, refractory to OCP use   Plan:  Laparoscopy with peritoneal biopsies to rule out endometriosis  Preoperative counseling: The patient is to undergo laparoscopy with peritoneal biopsies on 01/31/2015.  She is understanding of the planned procedure and is aware of and is accepting of all surgical risks which include but are not limited to, leading, infection, pelvic organ injury with need for repair, blood clot disorders, anesthesia risks, etc.  All questions have been answered.  Informed consent is given.  Patient is ready and willing to proceed with surgery as scheduled.  Herold HarmsMartin A Davius [place]deau, MD  Note: This dictation was prepared with Dragon dictation along with smaller phrase technology. Any transcriptional errors that result from this process are  unintentional.

## 2015-01-27 ENCOUNTER — Encounter: Payer: Self-pay | Admitting: *Deleted

## 2015-01-27 ENCOUNTER — Encounter
Admission: RE | Admit: 2015-01-27 | Discharge: 2015-01-27 | Disposition: A | Discharge status: Home / Self Care | Payer: Managed Care, Other (non HMO) | Source: Ambulatory Visit | Attending: Obstetrics and Gynecology | Admitting: Obstetrics and Gynecology

## 2015-01-27 DIAGNOSIS — R102 Pelvic and perineal pain: Secondary | ICD-10-CM | POA: Diagnosis not present

## 2015-01-27 DIAGNOSIS — Z01818 Encounter for other preprocedural examination: Secondary | ICD-10-CM | POA: Diagnosis present

## 2015-01-27 DIAGNOSIS — N939 Abnormal uterine and vaginal bleeding, unspecified: Secondary | ICD-10-CM | POA: Insufficient documentation

## 2015-01-27 LAB — CBC WITH DIFFERENTIAL/PLATELET
BASOS PCT: 1 %
Basophils Absolute: 0 10*3/uL (ref 0–0.1)
EOS ABS: 0.2 10*3/uL (ref 0–0.7)
EOS PCT: 3 %
HCT: 41.4 % (ref 35.0–47.0)
Hemoglobin: 13.9 g/dL (ref 12.0–16.0)
Lymphocytes Relative: 37 %
Lymphs Abs: 2.1 10*3/uL (ref 1.0–3.6)
MCH: 30.2 pg (ref 26.0–34.0)
MCHC: 33.5 g/dL (ref 32.0–36.0)
MCV: 90 fL (ref 80.0–100.0)
MONO ABS: 0.4 10*3/uL (ref 0.2–0.9)
MONOS PCT: 7 %
NEUTROS PCT: 52 %
Neutro Abs: 3 10*3/uL (ref 1.4–6.5)
PLATELETS: 205 10*3/uL (ref 150–440)
RBC: 4.6 MIL/uL (ref 3.80–5.20)
RDW: 12.6 % (ref 11.5–14.5)
WBC: 5.8 10*3/uL (ref 3.6–11.0)

## 2015-01-27 LAB — RAPID HIV SCREEN (HIV 1/2 AB+AG)
HIV 1/2 ANTIBODIES: NONREACTIVE
HIV-1 P24 ANTIGEN - HIV24: NONREACTIVE

## 2015-01-27 NOTE — Patient Instructions (Signed)
  Your procedure is scheduled on: 01-31-15 (MONDAY) Report to [facility] 2ND FLOOR To find out your arrival time please call [phone] between 1PM - 3PM on 01-28-15 (FRIDAY)  Remember: Instructions that are not followed completely may result in serious medical risk, up to and including death, or upon the discretion of your surgeon and anesthesiologist your surgery may need to be rescheduled.    _X___ 1. Do not eat food or drink liquids after midnight. No gum chewing or hard candies.     _X___ 2. No Alcohol for 24 hours before or after surgery.   ____ 3. Bring all medications with you on the day of surgery if instructed.    _X___ 4. Notify your doctor if there is any change in your medical condition     (cold, fever, infections).     Do not wear jewelry, make-up, hairpins, clips or nail polish.  Do not wear lotions, powders, or perfumes. You may wear deodorant.  Do not shave 48 hours prior to surgery. Men may shave face and neck.  Do not bring valuables to the hospital.    [facility] any belongings or valuables.               Contacts, dentures or bridgework may not be worn into surgery.  Leave your suitcase in the car. After surgery it may be brought to your room.  For patients admitted to the hospital, discharge time is determined by your treatment team.   Patients discharged the day of surgery will not be allowed to drive home.   Please read over the following fact sheets that you were given:      ____ Take these medicines the morning of surgery with A SIP OF WATER:    1. NONE  2.   3.   4.  5.  6.  ____ Fleet Enema (as directed)   _X___ Use CHG Soap as directed  ____ Use inhalers on the day of surgery  ____ Stop metformin 2 days prior to surgery    ____ Take 1/2 of usual insulin dose the night before surgery and none on the morning of surgery.   ____ Stop Coumadin/Plavix/aspirin-N/A  ____ Stop Anti-inflammatories-NO  NSAIDS OR ASPIRIN PRODUCTS-TYLENOL OK   ____ Stop supplements until after surgery.    ____ Bring C-Pap to the hospital.

## 2015-01-28 LAB — RPR: RPR Ser Ql: NONREACTIVE

## 2015-01-31 ENCOUNTER — Ambulatory Visit: Payer: Managed Care, Other (non HMO) | Admitting: Anesthesiology

## 2015-01-31 ENCOUNTER — Ambulatory Visit
Admission: RE | Admit: 2015-01-31 | Discharge: 2015-01-31 | Disposition: A | Discharge status: Home / Self Care | Payer: Managed Care, Other (non HMO) | Source: Ambulatory Visit | Attending: Obstetrics and Gynecology | Admitting: Obstetrics and Gynecology

## 2015-01-31 ENCOUNTER — Encounter: Payer: Self-pay | Admitting: Anesthesiology

## 2015-01-31 ENCOUNTER — Encounter
Admission: RE | Disposition: A | Discharge status: Home / Self Care | Payer: Self-pay | Source: Ambulatory Visit | Attending: Obstetrics and Gynecology

## 2015-01-31 DIAGNOSIS — G8929 Other chronic pain: Secondary | ICD-10-CM | POA: Diagnosis present

## 2015-01-31 DIAGNOSIS — N949 Unspecified condition associated with female genital organs and menstrual cycle: Secondary | ICD-10-CM | POA: Diagnosis not present

## 2015-01-31 DIAGNOSIS — R102 Pelvic and perineal pain: Secondary | ICD-10-CM

## 2015-01-31 DIAGNOSIS — N8311 Corpus luteum cyst of right ovary: Secondary | ICD-10-CM | POA: Insufficient documentation

## 2015-01-31 DIAGNOSIS — N736 Female pelvic peritoneal adhesions (postinfective): Secondary | ICD-10-CM | POA: Diagnosis not present

## 2015-01-31 DIAGNOSIS — F17210 Nicotine dependence, cigarettes, uncomplicated: Secondary | ICD-10-CM | POA: Diagnosis not present

## 2015-01-31 DIAGNOSIS — N939 Abnormal uterine and vaginal bleeding, unspecified: Secondary | ICD-10-CM | POA: Diagnosis not present

## 2015-01-31 DIAGNOSIS — N946 Dysmenorrhea, unspecified: Secondary | ICD-10-CM | POA: Insufficient documentation

## 2015-01-31 DIAGNOSIS — N941 Unspecified dyspareunia: Secondary | ICD-10-CM

## 2015-01-31 DIAGNOSIS — N839 Noninflammatory disorder of ovary, fallopian tube and broad ligament, unspecified: Secondary | ICD-10-CM | POA: Diagnosis not present

## 2015-01-31 HISTORY — DX: Headache, unspecified: R51.9

## 2015-01-31 HISTORY — PX: LAPAROSCOPY: SHX197

## 2015-01-31 HISTORY — DX: Headache: R51

## 2015-01-31 HISTORY — DX: Panic disorder (episodic paroxysmal anxiety): F41.0

## 2015-01-31 LAB — POCT PREGNANCY, URINE: Preg Test, Ur: NEGATIVE

## 2015-01-31 SURGERY — LAPAROSCOPY, DIAGNOSTIC
Anesthesia: General | Wound class: Clean Contaminated

## 2015-01-31 MED ORDER — FENTANYL CITRATE (PF) 100 MCG/2ML IJ SOLN
INTRAMUSCULAR | Status: DC | PRN
  Administered 2015-01-31 (×4): 50 ug via INTRAVENOUS

## 2015-01-31 MED ORDER — DEXAMETHASONE SODIUM PHOSPHATE 10 MG/ML IJ SOLN
INTRAMUSCULAR | Status: DC | PRN
  Administered 2015-01-31: 10 mg via INTRAVENOUS

## 2015-01-31 MED ORDER — ONDANSETRON HCL 4 MG/2ML IJ SOLN
INTRAMUSCULAR | Status: DC | PRN
  Administered 2015-01-31: 4 mg via INTRAVENOUS

## 2015-01-31 MED ORDER — MIDAZOLAM HCL 2 MG/2ML IJ SOLN
INTRAMUSCULAR | Status: DC | PRN
  Administered 2015-01-31: 2 mg via INTRAVENOUS

## 2015-01-31 MED ORDER — FAMOTIDINE 20 MG PO TABS
20.0000 mg | ORAL_TABLET | Freq: Once | ORAL | Status: AC
  Administered 2015-01-31: 20 mg via ORAL

## 2015-01-31 MED ORDER — ONDANSETRON HCL 4 MG/2ML IJ SOLN: 4.0000 mg | Freq: Once | INTRAMUSCULAR | Status: DC | PRN

## 2015-01-31 MED ORDER — FENTANYL CITRATE (PF) 100 MCG/2ML IJ SOLN
25.0000 ug | INTRAMUSCULAR | Status: DC | PRN
  Administered 2015-01-31 (×4): 25 ug via INTRAVENOUS

## 2015-01-31 MED ORDER — OXYCODONE-ACETAMINOPHEN 5-325 MG PO TABS
ORAL_TABLET | ORAL | Status: AC
  Filled 2015-01-31: qty 1

## 2015-01-31 MED ORDER — LIDOCAINE HCL (CARDIAC) 20 MG/ML IV SOLN
INTRAVENOUS | Status: DC | PRN
  Administered 2015-01-31: 100 mg via INTRAVENOUS

## 2015-01-31 MED ORDER — LACTATED RINGERS IV SOLN
INTRAVENOUS | Status: DC
  Administered 2015-01-31: 10:00:00 via INTRAVENOUS

## 2015-01-31 MED ORDER — LACTATED RINGERS IV SOLN: INTRAVENOUS | Status: DC

## 2015-01-31 MED ORDER — ROCURONIUM BROMIDE 100 MG/10ML IV SOLN
INTRAVENOUS | Status: DC | PRN
Start: 2015-01-31 — End: 2015-01-31
  Administered 2015-01-31: 20 mg via INTRAVENOUS
  Administered 2015-01-31: 10 mg via INTRAVENOUS

## 2015-01-31 MED ORDER — KETOROLAC TROMETHAMINE 30 MG/ML IJ SOLN
INTRAMUSCULAR | Status: DC | PRN
  Administered 2015-01-31: 30 mg via INTRAVENOUS

## 2015-01-31 MED ORDER — GLYCOPYRROLATE 0.2 MG/ML IJ SOLN
INTRAMUSCULAR | Status: DC | PRN
  Administered 2015-01-31: 0.4 mg via INTRAVENOUS

## 2015-01-31 MED ORDER — FAMOTIDINE 20 MG PO TABS
ORAL_TABLET | ORAL | Status: AC
  Administered 2015-01-31: 20 mg via ORAL
  Filled 2015-01-31: qty 1

## 2015-01-31 MED ORDER — OXYCODONE-ACETAMINOPHEN 5-325 MG PO TABS: 1.0000 | ORAL_TABLET | ORAL | Status: DC | PRN

## 2015-01-31 MED ORDER — IBUPROFEN 800 MG PO TABS
800.0000 mg | ORAL_TABLET | Freq: Three times a day (TID) | ORAL | Status: DC
Start: 2015-01-31 — End: 2016-02-02

## 2015-01-31 MED ORDER — PROPOFOL 10 MG/ML IV BOLUS
INTRAVENOUS | Status: DC | PRN
  Administered 2015-01-31: 150 mg via INTRAVENOUS
  Administered 2015-01-31: 30 mg via INTRAVENOUS

## 2015-01-31 MED ORDER — FENTANYL CITRATE (PF) 100 MCG/2ML IJ SOLN
INTRAMUSCULAR | Status: AC
  Filled 2015-01-31: qty 2

## 2015-01-31 MED ORDER — NEOSTIGMINE METHYLSULFATE 10 MG/10ML IV SOLN
INTRAVENOUS | Status: DC | PRN
  Administered 2015-01-31: 3 mg via INTRAVENOUS

## 2015-01-31 MED ORDER — OXYCODONE-ACETAMINOPHEN 5-325 MG PO TABS
1.0000 | ORAL_TABLET | ORAL | Status: DC | PRN
  Administered 2015-01-31: 1 via ORAL

## 2015-01-31 SURGICAL SUPPLY — 34 items
BLADE J PLASMA LAP HANDPIECE (INSTRUMENTS) ×3 IMPLANT
BLADE SURG SZ11 CARB STEEL (BLADE) ×3 IMPLANT
BNDG ADH 2 X3.75 FABRIC TAN LF (GAUZE/BANDAGES/DRESSINGS) IMPLANT
CANISTER SUCT 1200ML W/VALVE (MISCELLANEOUS) ×3 IMPLANT
CATH ROBINSON RED A/P 16FR (CATHETERS) ×3 IMPLANT
CHLORAPREP W/TINT 26ML (MISCELLANEOUS) ×3 IMPLANT
DRSG TEGADERM 2-3/8X2-3/4 SM (GAUZE/BANDAGES/DRESSINGS) ×9 IMPLANT
DRSG TELFA 3X8 NADH (GAUZE/BANDAGES/DRESSINGS) ×3 IMPLANT
GLOVE BIO SURGEON STRL SZ8 (GLOVE) ×3 IMPLANT
GLOVE INDICATOR 8.0 STRL GRN (GLOVE) ×3 IMPLANT
GOWN STRL REUS W/ TWL LRG LVL3 (GOWN DISPOSABLE) ×1 IMPLANT
GOWN STRL REUS W/ TWL XL LVL3 (GOWN DISPOSABLE) ×1 IMPLANT
GOWN STRL REUS W/TWL LRG LVL3 (GOWN DISPOSABLE) ×2
GOWN STRL REUS W/TWL XL LVL3 (GOWN DISPOSABLE) ×2
IRRIGATION STRYKERFLOW (MISCELLANEOUS) IMPLANT
IRRIGATOR STRYKERFLOW (MISCELLANEOUS)
IV LACTATED RINGERS 1000ML (IV SOLUTION) IMPLANT
KIT RM TURNOVER CYSTO AR (KITS) ×3 IMPLANT
LABEL OR SOLS (LABEL) IMPLANT
NS IRRIG 1000ML POUR BTL (IV SOLUTION) IMPLANT
NS IRRIG 500ML POUR BTL (IV SOLUTION) ×3 IMPLANT
PACK GYN LAPAROSCOPIC (MISCELLANEOUS) ×3 IMPLANT
PAD OB MATERNITY 4.3X12.25 (PERSONAL CARE ITEMS) ×3 IMPLANT
PAD PREP 24X41 OB/GYN DISP (PERSONAL CARE ITEMS) ×3 IMPLANT
POUCH ENDO CATCH 10MM SPEC (MISCELLANEOUS) IMPLANT
SCISSORS METZENBAUM CVD 33 (INSTRUMENTS) IMPLANT
SLEEVE ENDOPATH XCEL 5M (ENDOMECHANICALS) ×6 IMPLANT
SUT MON AB 4-0 RB1 27 (SUTURE) ×3 IMPLANT
SUT VIC AB 0 CT2 27 (SUTURE) IMPLANT
SUT VIC AB 0 UR5 27 (SUTURE) IMPLANT
SUT VIC AB 4-0 FS2 27 (SUTURE) IMPLANT
SYR 20CC LL (SYRINGE) ×3 IMPLANT
TROCAR XCEL NON-BLD 5MMX100MML (ENDOMECHANICALS) ×3 IMPLANT
TUBING INSUFFLATOR HI FLOW (MISCELLANEOUS) ×3 IMPLANT

## 2015-01-31 NOTE — Progress Notes (Signed)
Pt pregnancy test negative. 

## 2015-01-31 NOTE — Op Note (Signed)
Amanda Allen PROCEDURE DATE: 01/31/2015 12:04 PM  PREOPERATIVE DIAGNOSIS: CHRONIC PELVIC PAIN; ABNORMAL UTERINE BLEEDING  POSTOPERATIVE DIAGNOSIS: POSSIBLE ENDOMETRIOSIS, PELVIC ADHESIONS PROCEDURE: Procedure(s): LAPAROSCOPY DIAGNOSTIC WITH PERITONEAL BIOPSIES; LYSIS OF ADHESIONS (N/A) SURGEON:  Dr. Daphine DeutscherMartin A Jefte Carithers ASSISTANT: None ANESTHESIA: General Endotracheal  INDICATIONS: 25 y.o. No obstetric history on file. with history of chronic pelvic pain concerning for endometriosis desiring surgical evaluation.   Please see preoperative notes for further details.   FINDINGS:  Small uterus, normal ovaries and fallopian tubes bilaterally; right ovary showed a corpus luteum cyst..  Left adnexa had pelvic adhesions between bowel and tube which were lysed. Additional adhesions between the bowel and pelvic sidewall were also lysed. There was evidence of yellow lesion and white lesions in the cul-de-sac consistent with probable endometriosis. There was evidence of white lesions in the right ovarian fossa near the ureter which were biopsied and treated with the J plasma. Left ovarian fossa hyperemia just beneath the ureter was also biopsied and treated with J plasma. I/O's: Total I/O In: 700 [I.V.:700] Out: 120 [Urine:100; Blood:20] SPECIMENS: Peritoneal biopsies (cul-de-sac, right ovarian fossa, left ovarian fossa) COMPLICATIONS: None immediate COUNTS:  YES  PROCEDURE IN DETAIL: The patient was brought to the operating room where she was placed in the supine position.  General endotracheal anesthesia was induced without difficulty.  She was placed in the dorsal lithotomy position using bumblebee stirrups.  A ChloraPrep and Betadine, abdominal, perineal, intravaginal prep and drape was performed in standard fashion.  The timeout was completed.  The red Robison catheter was used to drain the bladder of clear urine.  A weighted speculum was placed into the vagina and a Hulka tenaculum was placed onto  the cervix to facilitate uterine manipulation. Laparoscopy was performed in standard fashion.  The Optiview 5 mm trocar and sleeve were placed through a 5 mm subumbilical incision directly into the abdominal pelvic cavity, without evidence of bowel or vascular injury. 2 Additional 5 mm ports were placed in the lower quadrants, respectively, under direct visualization.  The above noted findings were photo documented. Biopsies of the cul-de-sac, right ovarian fossa, and left ovarian fossa were taken. The pelvis was irrigated with the irrigant fluid being aspirated. The adnexal adhesions/bowel adhesions/pelvic sidewall adhesions were then taken down through sharp dissection using the J plasma as well as blunt dissection. This restored normal anatomy to the adnexa and bowel. The areas of biopsy were also treated with the J plasma in order to cauterize microscopic endometriosis lesions. Good hemostasis was obtained. Following inspection of the pelvis and upper abdomen, the procedure was then terminated with all auscultation being removed from the abdominal pelvic cavity. Pneumoperitoneum was released. The incisions were closed with simple interrupted sutures of 3-0 Monocryl. Tegaderm and Telfa dressings were applied. Patient was then awakened extubated and taken to recovery in satisfactory condition.  Amanda HarmsMartin A Derrico Zhong, MD [facility]

## 2015-01-31 NOTE — Interval H&P Note (Signed)
History and Physical Interval Note:  01/31/2015 10:26 AM  Amanda Allen  has presented today for surgery, with the diagnosis of chronic pelvic pain refractory to medical therapy along with abnormal uterine bleeding refractory to medical therapy  The various methods of treatment have been discussed with the patient and family. After consideration of risks, benefits and other options for treatment, the patient has consented to  laparoscopy with peritoneal biopsies as a surgical intervention .  The patient's history has been reviewed, patient examined, no change in status, stable for surgery.  I have reviewed the patient's chart and labs.  Questions were answered to the patient's satisfaction.     Daphine DeutscherMartin A Belkys Henault

## 2015-01-31 NOTE — Discharge Instructions (Addendum)
AMBULATORY SURGERY  DISCHARGE INSTRUCTIONS   1) The drugs that you were given will stay in your system until tomorrow so for the next 24 hours you should not:  A) Drive an automobile B) Make any legal decisions C) Drink any alcoholic beverage  2) You may resume regular meals tomorrow.  Today it is better to start with liquids and gradually work up to solid foods.  You may eat anything you prefer, but it is better to start with liquids, then soup and crackers, and gradually work up to solid foods.  3) Please notify your doctor immediately if you have any unusual bleeding, trouble breathing, redness and pain at the surgery site, drainage, fever, or pain not relieved by medication.  4) Additional Instructions: start taking a stool softener such as Docusate/Colace to prevent constipation  Please contact your physician with any problems or Same Day Surgery at [phone], Monday through Friday 6 am to 4 pm, or [place] [facility] Main number at [phone].  Diagnostic Laparoscopy A diagnostic laparoscopy is a procedure to diagnose diseases in the abdomen. During the procedure, a thin, lighted, pencil-sized instrument called a laparoscope is inserted into the abdomen through an incision. The laparoscope allows your health care provider to look at the organs inside your body. LET [facility] PROVID[facility] KNOW ABOUT:  Any allergies you have.  All medicines you are taking, including vitamins, herbs, eye drops, creams, and over-the-counter medicines.  Previous problems you or members of your family have had with the use of anesthetics.  Any blood disorders you have.  Previous surgeries you have had.  Medical conditions you have. RISKS AND COMPLICATIONS  Generally, this is a safe procedure. However, problems can occur, which may include:  Infection.  Bleeding.  Damage to other organs.  Allergic reaction to the anesthetics used during the procedure. BEFORE THE PROCEDURE  Do  not eat or drink anything after midnight on the night before the procedure or as directed by your health care provider.  Ask your health care provider about:  Changing or stopping your regular medicines.  Taking medicines such as aspirin and ibuprofen. These medicines can thin your blood. Do not take these medicines before your procedure if your health care provider instructs you not to.  Plan to have someone take you home after the procedure. PROCEDURE  You may be given a medicine to help you relax (sedative).  You will be given a medicine to make you sleep (general anesthetic).  Your abdomen will be inflated with a gas. This will make your organs easier to see.  Small incisions will be made in your abdomen.  A laparoscope and other small instruments will be inserted into the abdomen through the incisions.  A tissue sample may be removed from an organ in the abdomen for examination.  The instruments will be removed from the abdomen.  The gas will be released.  The incisions will be closed with stitches (sutures). AFTER THE PROCEDURE  Your blood pressure, heart rate, breathing rate, and blood oxygen level will be monitored often until the medicines you were given have worn off.   This information is not intended to replace advice given to you by your health care provider. Make sure you discuss any questions you have with your health care provider.   Document Released: 04/16/2000 Document Revised: 09/29/2014 Document Reviewed: 08/21/2013 Elsevier Interactive Patient Education [employer]2016 [employer]vier Inc.

## 2015-01-31 NOTE — Anesthesia Postprocedure Evaluation (Signed)
Anesthesia Post Note  Patient: Amanda Allen  Procedure(s) Performed: Procedure(s) (LRB): LAPAROSCOPY DIAGNOSTIC WITH PERITONEAL BIOPSIES; LYSIS OF ADHESIONS (N/A)  Patient location during evaluation: PACU Anesthesia Type: General Level of consciousness: awake Pain management: pain level controlled Vital Signs Assessment: post-procedure vital signs reviewed and stable Respiratory status: spontaneous breathing Cardiovascular status: blood pressure returned to baseline Anesthetic complications: no    Last Vitals:  Filed Vitals:   01/31/15 1419 01/31/15 1451  BP: 103/63 96/58  Pulse: 62 71  Temp:    Resp:      Last Pain:  Filed Vitals:   01/31/15 1452  PainSc: 7                  Marrion Finan S

## 2015-01-31 NOTE — Anesthesia Preprocedure Evaluation (Signed)
Anesthesia Evaluation  Patient identified by MRN, date of birth, ID band Patient awake    Reviewed: Allergy & Precautions, NPO status , Patient's Chart, lab work & pertinent test results, reviewed documented beta blocker date and time   Airway Mallampati: II  TM Distance: >3 FB     Dental  (+) Chipped   Pulmonary Current Smoker,           Cardiovascular      Neuro/Psych  Headaches, Anxiety    GI/Hepatic   Endo/Other    Renal/GU      Musculoskeletal   Abdominal   Peds  Hematology   Anesthesia Other Findings   Reproductive/Obstetrics                             Anesthesia Physical Anesthesia Plan  ASA: II  Anesthesia Plan: General   Post-op Pain Management:    Induction: Intravenous  Airway Management Planned: Oral ETT  Additional Equipment:   Intra-op Plan:   Post-operative Plan:   Informed Consent: I have reviewed the patients History and Physical, chart, labs and discussed the procedure including the risks, benefits and alternatives for the proposed anesthesia with the patient or authorized representative who has indicated his/her understanding and acceptance.     Plan Discussed with: CRNA  Anesthesia Plan Comments:         Anesthesia Quick Evaluation

## 2015-01-31 NOTE — Transfer of Care (Signed)
Immediate Anesthesia Transfer of Care Note  Patient: Amanda Allen  Procedure(s) Performed: Procedure(s): LAPAROSCOPY DIAGNOSTIC WITH PERITONEAL BIOPSIES; LYSIS OF ADHESIONS (N/A)  Patient Location: PACU  Anesthesia Type:General  Level of Consciousness: awake, alert , oriented and patient cooperative  Airway & Oxygen Therapy: Patient Spontanous Breathing and Patient connected to nasal cannula oxygen  Post-op Assessment: Report given to RN, Post -op Vital signs reviewed and stable and Patient moving all extremities  Post vital signs: Reviewed and stable  Last Vitals:  Filed Vitals:   01/31/15 0925 01/31/15 1210  BP: 116/66 114/69  Pulse: 73 59  Temp: 36.8 C 36.5 C  Resp: 16 12    Complications: No apparent anesthesia complications

## 2015-01-31 NOTE — H&P (View-Only) (Signed)
Subjective: HISTORY AND PHYSICAL-PREOPERATIVE    Patient is a 25 y.o. Para 1001 .female scheduled for Laparoscopy with peritoneal biopsies on 01/31/2015. Indications for procedure are Dysmenorrhea and pelvic pain(Deep thrusting dyspareunia).    Pertinent Gynecological History: Menses: Heavy, regular Contraception: Junel 1.5/30 Long history of dysmenorrhea and menorrhagia suboptimally controlled on birth control pills   Menstrual History: Menarche age: Not applicable  Patient's last menstrual period was 12/29/2014 (exact date).    Past Medical History  Diagnosis Date  . Pilonidal cyst   . Irregular menses   . Nipple discharge in female     occasional  . Dysmenorrhea     Past Surgical History  Procedure Laterality Date  . Pilonidal cyst excision     Past OB history: Para 1001  Social History   Social History  . Marital Status: Single    Spouse Name: N/A  . Number of Children: N/A  . Years of Education: N/A   Social History Main Topics  . Smoking status: Current Every Day Smoker -- 0.25 packs/day for 2 years    Types: Cigarettes  . Smokeless tobacco: Never Used  . Alcohol Use: No  . Drug Use: No  . Sexual Activity: Yes    Birth Control/ Protection: Pill   Other Topics Concern  . None   Social History Narrative    Family History  Problem Relation Age of Onset  . Cancer Paternal Grandmother     pgm-sister  . Hyperlipidemia Mother   . Cancer Maternal Grandfather     prostate     (Not in a hospital admission)  No Known Allergies  Review of Systems Constitutional: No recent fever/chills/sweats Respiratory: No recent cough/bronchitis Cardiovascular: No chest pain Gastrointestinal: No recent nausea/vomiting/diarrhea Genitourinary: No UTI symptoms Hematologic/lymphatic:No history of coagulopathy or recent blood thinner use    Objective:    BP 98/61 mmHg  Pulse 86  Ht 5' 4" (1.626 m)  Wt 135 lb 12.8 oz (61.598 kg)  BMI 23.30 kg/m2  LMP 12/29/2014  (Exact Date)  General:   Normal  Skin:   normal  HEENT:  Normal  Neck:  Supple without Adenopathy or Thyromegaly  Lungs:   Heart:              Breasts:   Abdomen:  Pelvis:  M/S   Extremeties:  Neuro:    clear to auscultation bilaterally   Normal without murmur   Not Examined   soft, non-tender; bowel sounds normal; no masses,  no organomegaly   Pelvic exam: External genitalia-normal BUS-normal Vagina-normal. Cervix-parous; 1/4. Cervical motion tenderness. Uterus-midplane to retroverted, mobile, 1/4 tender. Adnexa-nonpalpable, nontender. Rectovaginal-normal. External exam   No CVAT  Warm/Dry   Normal          Assessment:    1.  Dysmenorrhea. 2.  Pelvic pain, refractory to OCP use   Plan:  Laparoscopy with peritoneal biopsies to rule out endometriosis  Preoperative counseling: The patient is to undergo laparoscopy with peritoneal biopsies on 01/31/2015.  She is understanding of the planned procedure and is aware of and is accepting of all surgical risks which include but are not limited to, leading, infection, pelvic organ injury with need for repair, blood clot disorders, anesthesia risks, etc.  All questions have been answered.  Informed consent is given.  Patient is ready and willing to proceed with surgery as scheduled.  Yenni Carra A Mykenzie Ebanks, MD  Note: This dictation was prepared with Dragon dictation along with smaller phrase technology. Any transcriptional errors that result from   this process are unintentional.

## 2015-01-31 NOTE — Anesthesia Procedure Notes (Signed)
Procedure Name: Intubation Date/Time: 01/31/2015 10:47 AM Performed by: Peyton NajjarSIMMONS, Artha Chiasson Pre-anesthesia Checklist: Patient identified, Patient being monitored, Timeout performed, Emergency Drugs available and Suction available Patient Re-evaluated:Patient Re-evaluated prior to inductionOxygen Delivery Method: Circle system utilized Preoxygenation: Pre-oxygenation with 100% oxygen Intubation Type: IV induction Ventilation: Mask ventilation without difficulty Laryngoscope Size: Mac and 3 Grade View: Grade I Tube type: Oral Tube size: 6.5 mm Number of attempts: 1 Placement Confirmation: ETT inserted through vocal cords under direct vision,  positive ETCO2 and breath sounds checked- equal and bilateral Secured at: 20 cm Tube secured with: Tape Dental Injury: Teeth and Oropharynx as per pre-operative assessment

## 2015-02-01 ENCOUNTER — Encounter: Payer: Self-pay | Admitting: Obstetrics and Gynecology

## 2015-02-01 LAB — SURGICAL PATHOLOGY

## 2015-02-03 ENCOUNTER — Telehealth: Payer: Self-pay | Admitting: Obstetrics and Gynecology

## 2015-02-03 MED ORDER — HYDROCODONE-ACETAMINOPHEN 5-325MG PREPACK (~~LOC~~: 1.0000 | ORAL_TABLET | Freq: Once | ORAL | Status: DC

## 2015-02-03 NOTE — Telephone Encounter (Signed)
Taking motrin 800 q 8 h- pain is dull. Very sore today d/t vomiting on percocet. Ok to give vicodin per mad. Script left up front for p/u. Pt aware.

## 2015-02-03 NOTE — Telephone Encounter (Signed)
PT CALLED AND SHE HAD SURGERY Monday WITH DR DE AND HE PRESCRIBED HER PERCOCET AND IT HAS UPSET HER STOMACH REALLY BAD TO WEAR SHE IS VOMITTING, SHE WANTED TO KNOW IF THERE IS SOMETHING ELSE THAT CAN BE CALLED IN TO TAKE THE PLACE OF PERCOCET.

## 2015-02-08 ENCOUNTER — Ambulatory Visit (INDEPENDENT_AMBULATORY_CARE_PROVIDER_SITE_OTHER): Payer: Managed Care, Other (non HMO) | Admitting: Obstetrics and Gynecology

## 2015-02-08 ENCOUNTER — Encounter: Payer: Self-pay | Admitting: Obstetrics and Gynecology

## 2015-02-08 VITALS — BP 143/73 | HR 98 | Ht 64.0 in | Wt 135.8 lb

## 2015-02-08 DIAGNOSIS — Z09 Encounter for follow-up examination after completed treatment for conditions other than malignant neoplasm: Secondary | ICD-10-CM

## 2015-02-08 DIAGNOSIS — N941 Unspecified dyspareunia: Secondary | ICD-10-CM

## 2015-02-08 DIAGNOSIS — N946 Dysmenorrhea, unspecified: Secondary | ICD-10-CM

## 2015-02-08 DIAGNOSIS — N939 Abnormal uterine and vaginal bleeding, unspecified: Secondary | ICD-10-CM

## 2015-02-08 DIAGNOSIS — R102 Pelvic and perineal pain unspecified side: Secondary | ICD-10-CM

## 2015-02-08 NOTE — Progress Notes (Signed)
Patient ID: Amanda Allen, female   DOB: January 20, 1991, 25 y.o.   MRN: [id number]    Chief complaint: 1.  One week postop check. 2.  Status post laparoscopy with adhesiolysis and peritoneal biopsies. 3.  Abnormal uterine bleeding. 4.  Chronic pelvic pain (central and deep thrusting dyspareunia).  1 week post op lap w/bx Taking ibup q 8 did not p/u vicodin Bowel/bladder-normal  Incision on rt side- red no oozing  Bleeding started 02/02/15- changing tampon q 6h Taking junel 1.5/30  OBJECTIVE: BP 143/73 mmHg  Pulse 98  Ht  (1.626 m)  Wt 135 lb 12.8 oz (61.598 kg)  BMI 23.30 kg/m2  LMP 02/02/2015 Incisions-well approximated without evidence of infection; residual suture in subumbilical incision. Abdomen-soft, nontender.  ASSESSMENT: 1.  Normal one-week postop check. 2.  Status post laparoscopy with adhesiolysis and peritoneal biopsies. 3.  Abnormal uterine bleeding,Refractory to birth control pills 4.  Chronic pelvic pain (central and deep thrusting dyspareunia). 5.  Possible endometriosis based on visual diagnosis of laparoscopy, but not confirmed by biopsies.  PLAN: 1.  Options of management were reviewed including continuous OCPs, maintaining cyclic OCP use, trial of Depo-Lupron. 2.  Patient wishes to proceed with Depo-Lupron therapy trial.  She will be notified when the patient arrives for her first injection.

## 2015-02-08 NOTE — Patient Instructions (Signed)
1.  All normal activities.  May be resumed. 2.  We will initiate Depo-Lupron therapy next week when medication arrives.  We will contact you in the medication comes in. 3.  Monthly follow-up will be performed while you're taking Depo-Lupron therapy.

## 2015-02-17 ENCOUNTER — Emergency Department: Payer: Managed Care, Other (non HMO)

## 2015-02-17 ENCOUNTER — Ambulatory Visit (INDEPENDENT_AMBULATORY_CARE_PROVIDER_SITE_OTHER): Payer: Managed Care, Other (non HMO) | Admitting: Family Medicine

## 2015-02-17 ENCOUNTER — Encounter: Payer: Self-pay | Admitting: Family Medicine

## 2015-02-17 ENCOUNTER — Emergency Department
Admission: EM | Admit: 2015-02-17 | Discharge: 2015-02-17 | Disposition: A | Discharge status: Home / Self Care | Payer: Managed Care, Other (non HMO) | Attending: Emergency Medicine | Admitting: Emergency Medicine

## 2015-02-17 VITALS — BP 100/80 | HR 94 | Temp 98.1°F | Ht 64.0 in | Wt 134.0 lb

## 2015-02-17 DIAGNOSIS — H9209 Otalgia, unspecified ear: Secondary | ICD-10-CM | POA: Insufficient documentation

## 2015-02-17 DIAGNOSIS — J22 Unspecified acute lower respiratory infection: Secondary | ICD-10-CM

## 2015-02-17 DIAGNOSIS — J4540 Moderate persistent asthma, uncomplicated: Secondary | ICD-10-CM | POA: Diagnosis not present

## 2015-02-17 DIAGNOSIS — J988 Other specified respiratory disorders: Secondary | ICD-10-CM

## 2015-02-17 DIAGNOSIS — F17210 Nicotine dependence, cigarettes, uncomplicated: Secondary | ICD-10-CM | POA: Insufficient documentation

## 2015-02-17 DIAGNOSIS — Z79899 Other long term (current) drug therapy: Secondary | ICD-10-CM | POA: Insufficient documentation

## 2015-02-17 DIAGNOSIS — J45901 Unspecified asthma with (acute) exacerbation: Secondary | ICD-10-CM | POA: Insufficient documentation

## 2015-02-17 DIAGNOSIS — J45909 Unspecified asthma, uncomplicated: Secondary | ICD-10-CM

## 2015-02-17 DIAGNOSIS — Z791 Long term (current) use of non-steroidal anti-inflammatories (NSAID): Secondary | ICD-10-CM | POA: Insufficient documentation

## 2015-02-17 DIAGNOSIS — R05 Cough: Secondary | ICD-10-CM | POA: Diagnosis present

## 2015-02-17 DIAGNOSIS — J209 Acute bronchitis, unspecified: Secondary | ICD-10-CM

## 2015-02-17 MED ORDER — ALBUTEROL SULFATE HFA 108 (90 BASE) MCG/ACT IN AERS: 2.0000 | INHALATION_SPRAY | Freq: Four times a day (QID) | RESPIRATORY_TRACT | Status: DC | PRN

## 2015-02-17 MED ORDER — ALBUTEROL SULFATE (2.5 MG/3ML) 0.083% IN NEBU: 2.5000 mg | INHALATION_SOLUTION | Freq: Once | RESPIRATORY_TRACT | Status: DC

## 2015-02-17 MED ORDER — AZITHROMYCIN 250 MG PO TABS: ORAL_TABLET | ORAL | Status: DC

## 2015-02-17 MED ORDER — PREDNISONE 10 MG PO TABS: ORAL_TABLET | ORAL | Status: DC

## 2015-02-17 MED ORDER — IPRATROPIUM-ALBUTEROL 0.5-2.5 (3) MG/3ML IN SOLN
3.0000 mL | Freq: Once | RESPIRATORY_TRACT | Status: AC
  Administered 2015-02-17: 3 mL via RESPIRATORY_TRACT
  Filled 2015-02-17: qty 3

## 2015-02-17 MED ORDER — PREDNISONE 20 MG PO TABS
60.0000 mg | ORAL_TABLET | Freq: Once | ORAL | Status: AC
  Administered 2015-02-17: 60 mg via ORAL
  Filled 2015-02-17: qty 3

## 2015-02-17 NOTE — Progress Notes (Signed)
Name: Amanda Allen   MRN: [id number]    DOB: 12-Apr-1990   Date:02/17/2015       Progress Note  Subjective  Chief Complaint  Chief Complaint  Patient presents with  . Sinusitis    cough and cong- yellow production. SOB    Shortness of Breath This is a new problem. The current episode started yesterday. The problem occurs constantly. The problem has been unchanged. Associated symptoms include wheezing. Pertinent negatives include no abdominal pain, chest pain, claudication, coryza, ear pain, fever, headaches, hemoptysis, leg pain, leg swelling, neck pain, orthopnea, PND, rash, rhinorrhea, sore throat, sputum production, swollen glands, syncope or vomiting. She has tried beta agonist inhalers for the symptoms. The treatment provided mild relief. There is no history of allergies, aspirin allergies, asthma, CAD, chronic lung disease, COPD, a heart failure, PE or pneumonia.    No problem-specific assessment & plan notes found for this encounter.   Past Medical History  Diagnosis Date  . Pilonidal cyst   . Irregular menses   . Nipple discharge in female     occasional  . Dysmenorrhea   . Headache     migraines  . Panic attack     Past Surgical History  Procedure Laterality Date  . Pilonidal cyst excision    . Tympanostomy tube placement    . Laparoscopy N/A 01/31/2015    Procedure: LAPAROSCOPY DIAGNOSTIC WITH PERITONEAL BIOPSIES; LYSIS OF ADHESIONS;  Surgeon: Herold Harms, MD;  Location: [facility] ORS;  Service: Gynecology;  Laterality: N/A;    Family History  Problem Relation Age of Onset  . Cancer Paternal Grandmother     pgm-sister  . Hyperlipidemia Mother   . Cancer Maternal Grandfather     prostate    Social History   Social History  . Marital Status: Single    Spouse Name: N/A  . Number of Children: N/A  . Years of Education: N/A   Occupational History  . Not on file.   Social History Main Topics  . Smoking status: Current Every Day Smoker -- 0.25  packs/day for 2 years    Types: Cigarettes  . Smokeless tobacco: Never Used  . Alcohol Use: No  . Drug Use: No  . Sexual Activity: Yes    Birth Control/ Protection: Pill   Other Topics Concern  . Not on file   Social History Narrative    No Known Allergies   Review of Systems  Constitutional: Negative for fever, chills, weight loss and malaise/fatigue.  HENT: Negative for ear discharge, ear pain, rhinorrhea and sore throat.   Eyes: Negative for blurred vision.  Respiratory: Positive for shortness of breath and wheezing. Negative for cough, hemoptysis and sputum production.   Cardiovascular: Negative for chest pain, palpitations, orthopnea, claudication, leg swelling, syncope and PND.  Gastrointestinal: Negative for heartburn, nausea, vomiting, abdominal pain, diarrhea, constipation, blood in stool and melena.  Genitourinary: Negative for dysuria, urgency, frequency and hematuria.  Musculoskeletal: Negative for myalgias, back pain, joint pain and neck pain.  Skin: Negative for rash.  Neurological: Negative for dizziness, tingling, sensory change, focal weakness and headaches.  Endo/Heme/Allergies: Negative for environmental allergies and polydipsia. Does not bruise/bleed easily.  Psychiatric/Behavioral: Negative for depression and suicidal ideas. The patient is not nervous/anxious and does not have insomnia.      Objective  Filed Vitals:   02/17/15 1133  BP: 100/80  Pulse: 94  Temp: 98.1 F (36.7 C)  TempSrc: Oral  Height:  (1.626 m)  Weight: 134 lb (  60.782 kg)  SpO2: 98%    Physical Exam  Constitutional: She is well-developed, well-nourished, and in no distress. No distress.  HENT:  Head: Normocephalic and atraumatic.  Right Ear: External ear normal.  Left Ear: External ear normal.  Nose: Nose normal.  Mouth/Throat: Oropharynx is clear and moist.  Eyes: Conjunctivae and EOM are normal. Pupils are equal, round, and reactive to light. Right eye exhibits no  discharge. Left eye exhibits no discharge.  Neck: Normal range of motion. Neck supple. No JVD present. No thyromegaly present.  Cardiovascular: Normal rate, regular rhythm, normal heart sounds and intact distal pulses.  Exam reveals no gallop and no friction rub.   No murmur heard. Pulmonary/Chest: Effort normal. No respiratory distress. She has wheezes. She has no rales. She exhibits no tenderness.  Abdominal: Soft. Bowel sounds are normal. She exhibits no mass. There is no tenderness. There is no guarding.  Musculoskeletal: Normal range of motion. She exhibits no edema.  Lymphadenopathy:    She has no cervical adenopathy.  Neurological: She is alert. She has normal reflexes.  Skin: Skin is warm and dry. She is not diaphoretic.  Psychiatric: Mood and affect normal.  Nursing note and vitals reviewed.     Assessment & Plan  Problem List Items Addressed This Visit    None    Visit Diagnoses    Reactive airway disease with wheezing, moderate persistent, uncomplicated    -  Primary    referral to er    Relevant Medications    albuterol (PROVENTIL) (2.5 MG/3ML) 0.083% nebulizer solution 2.5 mg (Start on 02/17/2015 12:15 PM)    Lower resp. tract infection             Dr. Hayden [facility]  02/17/2015

## 2015-02-17 NOTE — ED Notes (Signed)
Says sent here by dr d Yetta Barre for pnuemonia.

## 2015-02-17 NOTE — ED Provider Notes (Signed)
[facility] Emergency Department Provider Note ____________________________________________  Time seen: Approximately 1:55 PM  I have reviewed the triage vital signs and the nursing notes.   HISTORY  Chief Complaint Pneumonia  HPI Amanda Allen is a 25 y.o. female was sent to the emergency room today by Dr. Yetta Barre for possible pneumonia.Patient states she was sick for several days but today had worsening of her symptoms with a productive cough. Patient is unaware of any fever, denies any sore throat or ear pain. There is been no nausea or vomiting. Patient states in the past she has had bronchitis and at that time was prescribed an antibiotic and a inhaler. She has not used the inhaler due to the fact that she used the entire inhaler the last time she had bronchitis. The patient is a smoker and continues to smoke.   Past Medical History  Diagnosis Date  . Pilonidal cyst   . Irregular menses   . Nipple discharge in female     occasional  . Dysmenorrhea   . Headache     migraines  . Panic attack     Patient Active Problem List   Diagnosis Date Noted  . Pelvic peritoneal adhesions, female 01/31/2015  . Tobacco user 10/06/2014  . Abnormal uterine bleeding 10/06/2014  . Dysmenorrhea 10/06/2014  . Dyspareunia 10/06/2014    Past Surgical History  Procedure Laterality Date  . Pilonidal cyst excision    . Tympanostomy tube placement    . Laparoscopy N/A 01/31/2015    Procedure: LAPAROSCOPY DIAGNOSTIC WITH PERITONEAL BIOPSIES; LYSIS OF ADHESIONS;  Surgeon: Herold Harms, MD;  Location: [facility] ORS;  Service: Gynecology;  Laterality: N/A;    Current Outpatient Rx  Name  Route  Sig  Dispense  Refill  . albuterol (PROVENTIL HFA;VENTOLIN HFA) 108 (90 Base) MCG/ACT inhaler   Inhalation   Inhale 2 puffs into the lungs every 6 (six) hours as needed for wheezing or shortness of breath.   1 Inhaler   2   . azithromycin (ZITHROMAX Z-PAK) 250 MG tablet       Take 2 tablets (500 mg) on  Day 1,  followed by 1 tablet (250 mg) once daily on Days 2 through 5.   6 each   0   . ibuprofen (ADVIL,MOTRIN) 800 MG tablet   Oral   Take 1 tablet (800 mg total) by mouth 3 (three) times daily.   30 tablet   1   . JUNEL FE 1.5/30 1.5-30 MG-MCG tablet   Oral   Take 1 tablet by mouth daily.      11     Dispense as written.   . predniSONE (DELTASONE) 10 MG tablet      Take 5 tablets  tomorrow, on day 2 take 4 tablets, day 3 take 3 tablets, day 4 take 2 tablets, day 5 take  1 tablets   15 tablet   0     Allergies Review of patient's allergies indicates no known allergies.  Family History  Problem Relation Age of Onset  . Cancer Paternal Grandmother     pgm-sister  . Hyperlipidemia Mother   . Cancer Maternal Grandfather     prostate    Social History Social History  Substance Use Topics  . Smoking status: Current Every Day Smoker -- 0.25 packs/day for 2 years    Types: Cigarettes  . Smokeless tobacco: Never Used  . Alcohol Use: No    Review of Systems Constitutional: No fever/chills ENT: No  sore throat. Ear pain. Cardiovascular: Denies chest pain. Respiratory: Denies shortness of breath. Positive wheezing Gastrointestinal: No abdominal pain.  No nausea, no vomiting.  No diarrhea.   Genitourinary: Negative for dysuria. Musculoskeletal: Negative for back pain. Skin: Negative for rash. Neurological: Negative for headaches, focal weakness or numbness.  10-point ROS otherwise negative.  ____________________________________________   PHYSICAL EXAM:  VITAL SIGNS: ED Triage Vitals  Enc Vitals Group     BP 02/17/15 1243 116/78 mmHg     Pulse Rate 02/17/15 1243 101     Resp 02/17/15 1243 18     Temp 02/17/15 1243 98.4 F (36.9 C)     Temp Source 02/17/15 1243 Oral     SpO2 02/17/15 1243 96 %     Weight 02/17/15 1243 135 lb (61.236 kg)     Height 02/17/15 1243  (1.626 m)     Head Cir --      Peak Flow --      Pain  Score 02/17/15 1240 0     Pain Loc --      Pain Edu? --      Excl. in GC? --     Constitutional: Alert and oriented. Well appearing and in no acute distress. Eyes: Conjunctivae are normal. PERRL. EOMI. Head: Atraumatic. Nose: No congestion/rhinnorhea.    EACs are clear. TMs are dull without any erythema. Poor light reflex was seen. Mouth/Throat: Mucous membranes are moist.  Oropharynx non-erythematous. Also posterior drainage. Neck: No stridor.   Hematological/Lymphatic/Immunilogical: No cervical lymphadenopathy. Cardiovascular: Normal rate, regular rhythm. Grossly normal heart sounds.  Good peripheral circulation. Respiratory: Normal respiratory effort.  No retractions. Lungs bilateral expiratory wheezes heard throughout. Patient continues to talk in full sentences without any respiratory distress. Gastrointestinal: Soft and nontender. No distention. No abdominal bruits. No CVA tenderness. Musculoskeletal: No lower extremity tenderness nor edema.  No joint effusions. Neurologic:  Normal speech and language. No gross focal neurologic deficits are appreciated. No gait instability. Skin:  Skin is warm, dry and intact. No rash noted. Psychiatric: Mood and affect are normal. Speech and behavior are normal.  ____________________________________________   LABS (all labs ordered are listed, but only abnormal results are displayed)  Labs Reviewed - No data to display  RADIOLOGY  Chest x-ray per radiologist shows no active cardiopulmonary disease. ____________________________________________   PROCEDURES  Procedure(s) performed: None  Critical Care performed: No  ____________________________________________   INITIAL IMPRESSION / ASSESSMENT AND PLAN / ED COURSE  Pertinent labs & imaging results that were available during my care of the patient were reviewed by me and considered in my medical decision making (see chart for  details).  ----------------------------------------- 3:32 PM on 02/17/2015 ----------------------------------------- Patient is tremendously improved with prednisone and DuoNeb treatment and states she's felt much better. Patient was discharged with a prescription for prednisone, Zithromax, Proventil inhaler. She is to follow-up with Dr. Yetta Barre if any continued problems.  ____________________________________________   FINAL CLINICAL IMPRESSION(S) / ED DIAGNOSES  Final diagnoses:  Reactive airway disease with wheezing, unspecified asthma severity, uncomplicated  Acute bronchitis, unspecified organism  Cigarette smoker      Tommi Rumps, PA-C 02/17/15 1533  Jeanmarie Plant, MD 02/17/15 [phone]

## 2015-02-17 NOTE — Discharge Instructions (Signed)
Acute Bronchitis Bronchitis is when the airways that extend from the windpipe into the lungs get red, puffy, and painful (inflamed). Bronchitis often causes thick spit (mucus) to develop. This leads to a cough. A cough is the most common symptom of bronchitis. In acute bronchitis, the condition usually begins suddenly and goes away over time (usually in 2 weeks). Smoking, allergies, and asthma can make bronchitis worse. Repeated episodes of bronchitis may cause more lung problems. HOME CARE  Rest.  Drink enough fluids to keep your pee (urine) clear or pale yellow (unless you need to limit fluids as told by your doctor).  Only take over-the-counter or prescription medicines as told by your doctor.  Avoid smoking and secondhand smoke. These can make bronchitis worse. If you are a smoker, think about using nicotine gum or skin patches. Quitting smoking will help your lungs heal faster.  Reduce the chance of getting bronchitis again by:  Washing your hands often.  Avoiding people with cold symptoms.  Trying not to touch your hands to your mouth, nose, or eyes.  Follow up with your doctor as told. GET HELP IF: Your symptoms do not improve after 1 week of treatment. Symptoms include:  Cough.  Fever.  Coughing up thick spit.  Body aches.  Chest congestion.  Chills.  Shortness of breath.  Sore throat. GET HELP RIGHT AWAY IF:   You have an increased fever.  You have chills.  You have severe shortness of breath.  You have bloody thick spit (sputum).  You throw up (vomit) often.  You lose too much body fluid (dehydration).  You have a severe headache.  You faint. MAKE SURE YOU:   Understand these instructions.  Will watch your condition.  Will get help right away if you are not doing well or get worse.   This information is not intended to replace advice given to you by your health care provider. Make sure you discuss any questions you have with your health care  provider.   Document Released: 06/27/2007 Document Revised: 09/10/2012 Document Reviewed: 07/01/2012 Elsevier Interactive Patient Education [employer]   Follow-up with Dr. Yetta Barre if any continued problems. Take medication as prescribed. Use albuterol inhaler as needed for wheezing. Z-Pak as directed. And her next dose of prednisone will be tomorrow and taper until finished.

## 2015-07-08 ENCOUNTER — Telehealth: Payer: Self-pay

## 2015-07-08 NOTE — Telephone Encounter (Signed)
Patient would like a refill on her albuterol inhaler. Advised patient to contact pharmacy to send over refill request.

## 2015-09-22 ENCOUNTER — Other Ambulatory Visit: Payer: Self-pay | Admitting: Obstetrics and Gynecology

## 2016-02-02 ENCOUNTER — Encounter: Payer: Self-pay | Admitting: Family Medicine

## 2016-02-02 ENCOUNTER — Ambulatory Visit (INDEPENDENT_AMBULATORY_CARE_PROVIDER_SITE_OTHER): Payer: Managed Care, Other (non HMO) | Admitting: Family Medicine

## 2016-02-02 VITALS — BP 120/80 | HR 88 | Temp 99.3°F | Ht 64.0 in | Wt 137.0 lb

## 2016-02-02 DIAGNOSIS — J40 Bronchitis, not specified as acute or chronic: Secondary | ICD-10-CM | POA: Diagnosis not present

## 2016-02-02 DIAGNOSIS — J0100 Acute maxillary sinusitis, unspecified: Secondary | ICD-10-CM | POA: Diagnosis not present

## 2016-02-02 LAB — POCT INFLUENZA A/B
INFLUENZA A, POC: NEGATIVE
INFLUENZA B, POC: NEGATIVE

## 2016-02-02 LAB — POCT RAPID STREP A (OFFICE): Rapid Strep A Screen: NEGATIVE

## 2016-02-02 MED ORDER — AMOXICILLIN-POT CLAVULANATE 875-125 MG PO TABS
1.0000 | ORAL_TABLET | Freq: Two times a day (BID) | ORAL | 0 refills | Status: DC
Start: 2016-02-02 — End: 2016-03-22

## 2016-02-02 NOTE — Progress Notes (Signed)
Name: Amanda Allen   MRN: [id number]    DOB: [birth year]   Date:02/02/2016       Progress Note  Subjective  Chief Complaint  Chief Complaint  Patient presents with  . Sore Throat    body aches, neck stiffness, fever in the pm, nauseated    Sore Throat   This is a new problem. The current episode started in the past 7 days. The problem has been waxing and waning. The maximum temperature recorded prior to her arrival was 100.4 - 100.9 F. The fever has been present for 3 to 4 days. The pain is at a severity of 4/10. The pain is moderate. Associated symptoms include congestion, coughing, ear pain, headaches, a hoarse voice and neck pain. Pertinent negatives include no abdominal pain, diarrhea, drooling, ear discharge, shortness of breath, stridor or swollen glands. She has tried acetaminophen and NSAIDs for the symptoms. The treatment provided no relief.  Sinusitis  This is a new problem. The current episode started in the past 7 days. The problem has been waxing and waning since onset. The maximum temperature recorded prior to her arrival was 100.4 - 100.9 F. Associated symptoms include congestion, coughing, ear pain, headaches, a hoarse voice and neck pain. Pertinent negatives include no chills, shortness of breath, sore throat or swollen glands. (Yellow sinus discharge)  Cough  This is a new problem. The current episode started in the past 7 days. The problem has been waxing and waning. The cough is productive of purulent sputum (yellow /green). Associated symptoms include ear pain and headaches. Pertinent negatives include no chest pain, chills, fever, heartburn, myalgias, rash, sore throat, shortness of breath, weight loss or wheezing. Her past medical history is significant for bronchitis. There is no history of environmental allergies.    No problem-specific Assessment & Plan notes found for this encounter.   Past Medical History:  Diagnosis Date  . Dysmenorrhea   . Headache     migraines  . Irregular menses   . Nipple discharge in female    occasional  . Panic attack   . Pilonidal cyst     Past Surgical History:  Procedure Laterality Date  . LAPAROSCOPY N/A 01/31/2015   Procedure: LAPAROSCOPY DIAGNOSTIC WITH PERITONEAL BIOPSIES; LYSIS OF ADHESIONS;  Surgeon: Herold Harms, MD;  Location: [facility] ORS;  Service: Gynecology;  Laterality: N/A;  . PILONIDAL CYST EXCISION    . TYMPANOSTOMY TUBE PLACEMENT      Family History  Problem Relation Age of Onset  . Cancer Paternal Grandmother     pgm-sister  . Hyperlipidemia Mother   . Cancer Maternal Grandfather     prostate    Social History   Social History  . Marital status: Single    Spouse name: N/A  . Number of children: N/A  . Years of education: N/A   Occupational History  . Not on file.   Social History Main Topics  . Smoking status: Current Every Day Smoker    Packs/day: 0.25    Years: 2.00    Types: Cigarettes  . Smokeless tobacco: Never Used  . Alcohol use No  . Drug use: No  . Sexual activity: Yes    Birth control/ protection: Pill   Other Topics Concern  . Not on file   Social History Narrative  . No narrative on file    No Known Allergies   Review of Systems  Constitutional: Negative for chills, fever, malaise/fatigue and weight loss.  HENT: Positive for congestion, ear  pain and hoarse voice. Negative for drooling, ear discharge and sore throat.   Eyes: Negative for blurred vision.  Respiratory: Positive for cough. Negative for sputum production, shortness of breath, wheezing and stridor.   Cardiovascular: Negative for chest pain, palpitations and leg swelling.  Gastrointestinal: Negative for abdominal pain, blood in stool, constipation, diarrhea, heartburn, melena and nausea.  Genitourinary: Negative for dysuria, frequency, hematuria and urgency.  Musculoskeletal: Positive for neck pain. Negative for back pain, joint pain and myalgias.  Skin: Negative for rash.   Neurological: Positive for headaches. Negative for dizziness, tingling, sensory change and focal weakness.  Endo/Heme/Allergies: Negative for environmental allergies and polydipsia. Does not bruise/bleed easily.  Psychiatric/Behavioral: Negative for depression and suicidal ideas. The patient is not nervous/anxious and does not have insomnia.      Objective  Vitals:   02/02/16 1618  BP: 120/80  Pulse: 88  Temp: 99.3 F (37.4 C)  TempSrc: Oral  Weight: 137 lb (62.1 kg)  Height: 5\' 4"  (1.626 m)    Physical Exam  Constitutional: She is well-developed, well-nourished, and in no distress. No distress.  HENT:  Head: Normocephalic and atraumatic.  Right Ear: External ear normal.  Left Ear: External ear normal.  Nose: Right sinus exhibits no maxillary sinus tenderness. Left sinus exhibits maxillary sinus tenderness.  Mouth/Throat: Oropharynx is clear and moist.  Eyes: Conjunctivae and EOM are normal. Pupils are equal, round, and reactive to light. Right eye exhibits no discharge. Left eye exhibits no discharge.  Neck: Normal range of motion. Neck supple. No JVD present. No thyromegaly present.  Cardiovascular: Normal rate, regular rhythm, normal heart sounds and intact distal pulses.  Exam reveals no gallop and no friction rub.   No murmur heard. Pulmonary/Chest: Effort normal and breath sounds normal. She has no wheezes. She has no rales.  Abdominal: Soft. Bowel sounds are normal. She exhibits no mass. There is no tenderness. There is no guarding.  Musculoskeletal: Normal range of motion. She exhibits no edema.       Cervical back: She exhibits spasm.       Thoracic back: She exhibits spasm.       Lumbar back: She exhibits spasm.  Lymphadenopathy:       Head (right side): Submandibular adenopathy present. No submental adenopathy present.       Head (left side): Submandibular adenopathy present. No submental adenopathy present.    She has cervical adenopathy.       Right cervical:  Superficial cervical adenopathy present.       Left cervical: Superficial cervical adenopathy present.  Neurological: She is alert. She has normal reflexes.  Skin: Skin is warm and dry. She is not diaphoretic.  Psychiatric: Mood and affect normal.  Nursing note and vitals reviewed.     Assessment & Plan  Problem List Items Addressed This Visit    None    Visit Diagnoses    Acute maxillary sinusitis, recurrence not specified    -  Primary   Relevant Medications   amoxicillin-clavulanate (AUGMENTIN) 875-125 MG tablet   Bronchitis       Relevant Medications   amoxicillin-clavulanate (AUGMENTIN) 875-125 MG tablet   Other Relevant Orders   POCT rapid strep A (Completed)   POCT Influenza A/B (Completed)        Dr. Hayden Rasmusseneanna Cire Deyarmin [facility]  02/02/16

## 2016-02-03 ENCOUNTER — Ambulatory Visit: Payer: Managed Care, Other (non HMO) | Admitting: Family Medicine

## 2016-03-22 ENCOUNTER — Ambulatory Visit (INDEPENDENT_AMBULATORY_CARE_PROVIDER_SITE_OTHER): Payer: Managed Care, Other (non HMO) | Admitting: Family Medicine

## 2016-03-22 VITALS — BP 110/62 | HR 104 | Temp 99.0°F | Ht 64.0 in | Wt 142.0 lb

## 2016-03-22 DIAGNOSIS — N12 Tubulo-interstitial nephritis, not specified as acute or chronic: Secondary | ICD-10-CM | POA: Diagnosis not present

## 2016-03-22 LAB — POCT URINALYSIS DIPSTICK
BILIRUBIN UA: NEGATIVE
GLUCOSE UA: NEGATIVE
Ketones, UA: NEGATIVE
NITRITE UA: POSITIVE
Spec Grav, UA: 1.02
Urobilinogen, UA: 0.2
pH, UA: 6

## 2016-03-22 MED ORDER — SULFAMETHOXAZOLE-TRIMETHOPRIM 800-160 MG PO TABS: 1.0000 | ORAL_TABLET | Freq: Two times a day (BID) | ORAL | 0 refills | Status: DC

## 2016-03-22 NOTE — Progress Notes (Signed)
Name: Amanda Allen   MRN: [id number]    DOB: 10-Apr-1990   Date:03/22/2016       Progress Note  Subjective  Chief Complaint  Chief Complaint  Patient presents with  . Urinary Tract Infection    "bladder has been hurting for 3 days"- burning when urinating     Urinary Tract Infection   This is a new problem. The current episode started in the past 7 days. The problem occurs every urination. The problem has been waxing and waning. The quality of the pain is described as burning. The pain is at a severity of 3/10. The pain is moderate. There has been no fever. She is sexually active. There is no history of pyelonephritis. Associated symptoms include chills, frequency, hematuria, nausea, sweats and urgency. Pertinent negatives include no discharge, flank pain, hesitancy or vomiting. Treatments tried: fluids/cranberry/azo. The treatment provided no relief. There is no history of kidney stones or recurrent UTIs.    No problem-specific Assessment & Plan notes found for this encounter.   Past Medical History:  Diagnosis Date  . Dysmenorrhea   . Headache    migraines  . Irregular menses   . Nipple discharge in female    occasional  . Panic attack   . Pilonidal cyst     Past Surgical History:  Procedure Laterality Date  . LAPAROSCOPY N/A 01/31/2015   Procedure: LAPAROSCOPY DIAGNOSTIC WITH PERITONEAL BIOPSIES; LYSIS OF ADHESIONS;  Surgeon: Herold HarmsMartin A Defrancesco, MD;  Location: [facility] ORS;  Service: Gynecology;  Laterality: N/A;  . PILONIDAL CYST EXCISION    . TYMPANOSTOMY TUBE PLACEMENT      Family History  Problem Relation Age of Onset  . Cancer Paternal Grandmother     pgm-sister  . Hyperlipidemia Mother   . Cancer Maternal Grandfather     prostate    Social History   Social History  . Marital status: Single    Spouse name: N/A  . Number of children: N/A  . Years of education: N/A   Occupational History  . Not on file.   Social History Main Topics  . Smoking status:  Current Every Day Smoker    Packs/day: 0.25    Years: 2.00    Types: Cigarettes  . Smokeless tobacco: Never Used  . Alcohol use No  . Drug use: No  . Sexual activity: Yes    Birth control/ protection: Pill   Other Topics Concern  . Not on file   Social History Narrative  . No narrative on file    No Known Allergies  Outpatient Medications Prior to Visit  Medication Sig Dispense Refill  . CRYSELLE-28 0.3-30 MG-MCG tablet Take 1 tablet by mouth daily. Dr Curtis/ [facility]  3  . albuterol (PROVENTIL HFA;VENTOLIN HFA) 108 (90 Base) MCG/ACT inhaler Inhale 2 puffs into the lungs every 6 (six) hours as needed for wheezing or shortness of breath. (Patient not taking: Reported on 02/02/2016) 1 Inhaler 2  . amoxicillin-clavulanate (AUGMENTIN) 875-125 MG tablet Take 1 tablet by mouth 2 (two) times daily. 20 tablet 0   No facility-administered medications prior to visit.     Review of Systems  Constitutional: Positive for chills. Negative for fever, malaise/fatigue and weight loss.  HENT: Negative for ear discharge, ear pain and sore throat.   Eyes: Negative for blurred vision.  Respiratory: Negative for cough, sputum production, shortness of breath and wheezing.   Cardiovascular: Negative for chest pain, palpitations and leg swelling.  Gastrointestinal: Positive for nausea. Negative for abdominal  pain, blood in stool, constipation, diarrhea, heartburn, melena and vomiting.  Genitourinary: Positive for frequency, hematuria and urgency. Negative for dysuria, flank pain and hesitancy.  Musculoskeletal: Negative for back pain, joint pain, myalgias and neck pain.  Skin: Negative for rash.  Neurological: Negative for dizziness, tingling, sensory change, focal weakness and headaches.  Endo/Heme/Allergies: Negative for environmental allergies and polydipsia. Does not bruise/bleed easily.  Psychiatric/Behavioral: Negative for depression and suicidal ideas. The patient is not nervous/anxious and  does not have insomnia.      Objective  Vitals:   03/22/16 0836  BP: 110/62  Pulse: (!) 104  Temp: 99 F (37.2 C)  TempSrc: Oral  Weight: 142 lb (64.4 kg)  Height: 5\' 4"  (1.626 m)    Physical Exam  Constitutional: She is well-developed, well-nourished, and in no distress. No distress.  HENT:  Head: Normocephalic and atraumatic.  Right Ear: Tympanic membrane and external ear normal.  Left Ear: Tympanic membrane and external ear normal.  Nose: Nose normal.  Mouth/Throat: Uvula is midline and oropharynx is clear and moist. No posterior oropharyngeal erythema.  Eyes: Conjunctivae and EOM are normal. Pupils are equal, round, and reactive to light. Right eye exhibits no discharge. Left eye exhibits no discharge.  Neck: Normal range of motion. Neck supple. No JVD present. No thyromegaly present.  Cardiovascular: Normal rate, regular rhythm, normal heart sounds and intact distal pulses.  Exam reveals no gallop and no friction rub.   No murmur heard. Pulmonary/Chest: Effort normal and breath sounds normal. She has no wheezes. She has no rales.  Abdominal: Soft. Bowel sounds are normal. She exhibits no mass. There is no hepatosplenomegaly. There is tenderness in the suprapubic area. There is CVA tenderness. There is no rebound and no guarding.  Musculoskeletal: Normal range of motion. She exhibits no edema.  Lymphadenopathy:    She has no cervical adenopathy.  Neurological: She is alert. She has normal reflexes.  Skin: Skin is warm and dry. She is not diaphoretic.  Psychiatric: Mood and affect normal.  Vitals reviewed.     Assessment & Plan  Problem List Items Addressed This Visit    None    Visit Diagnoses    Pyelonephritis    -  Primary   Relevant Medications   sulfamethoxazole-trimethoprim (BACTRIM DS,SEPTRA DS) 800-160 MG tablet   Other Relevant Orders   POCT Urinalysis Dipstick (Completed)   Urine Culture      Meds ordered this encounter  Medications  .  sulfamethoxazole-trimethoprim (BACTRIM DS,SEPTRA DS) 800-160 MG tablet    Sig: Take 1 tablet by mouth 2 (two) times daily.    Dispense:  20 tablet    Refill:  0      Dr. Hayden [facility]  03/22/16

## 2016-03-24 LAB — URINE CULTURE

## 2016-03-24 LAB — PLEASE NOTE

## 2016-06-03 IMAGING — US US BREAST*L* LIMITED INC AXILLA
1 series · 2 of 2 positions shown · non-contrast
Comparison: Previous exam(s).

CLINICAL DATA: Patient presents for evaluation of focal tenderness
and palpable abnormality within the upper-outer left breast. The
patient states this has decreased in size and tenderness after her
last menstrual cycle.

EXAM:
ULTRASOUND OF THE LEFT BREAST

[Series 1: us breast*left* limited inc axilla · 0.08mm/px · 2 of 2 slices shown]
[im 1/2]
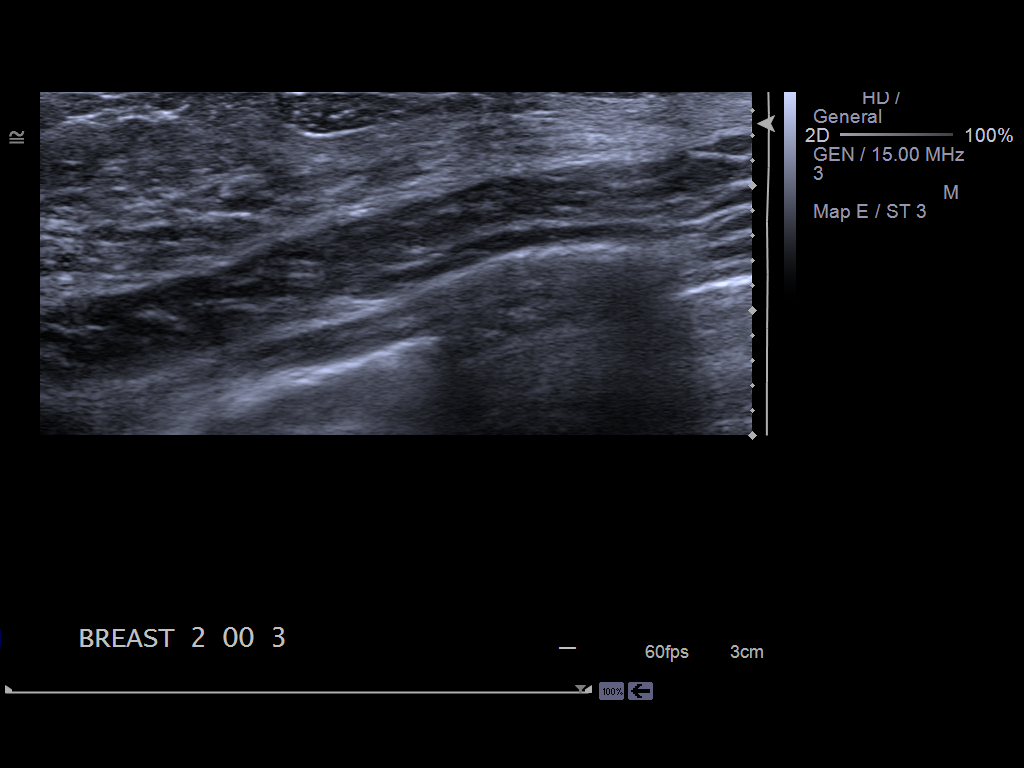
[im 2/2]
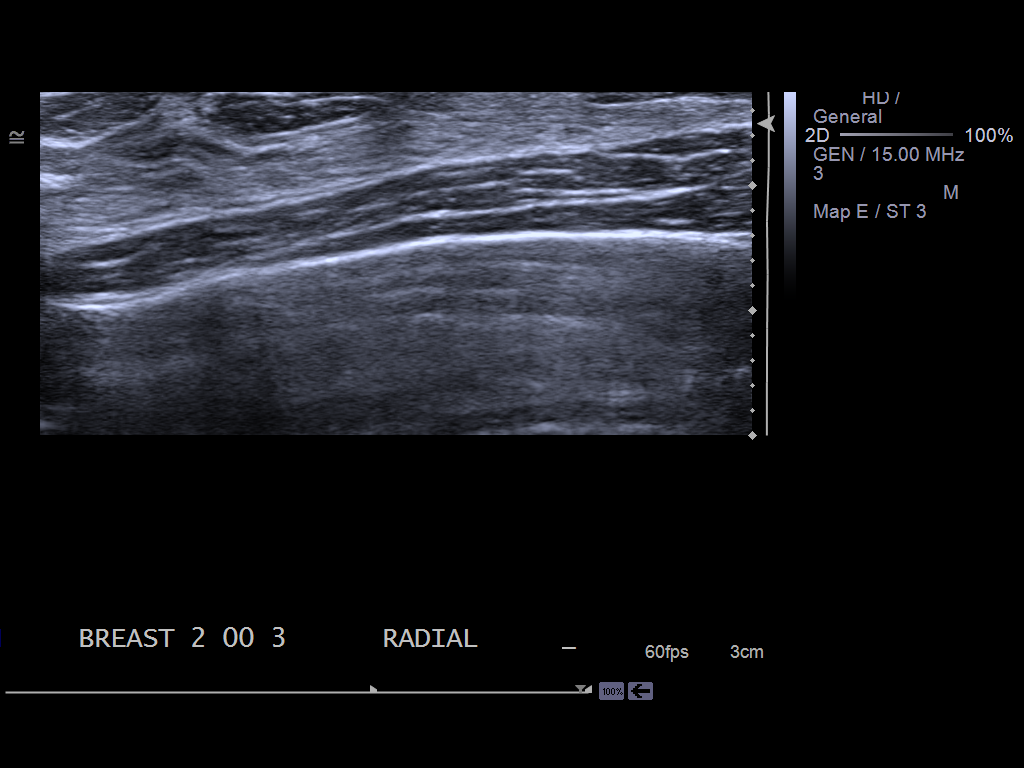

[2 of 2 positions shown; findings below may reference images not displayed]

FINDINGS: On physical exam, I palpate dense tissue within the upper-outer left
breast.

Targeted ultrasound is performed, showing normal appearing
fibroglandular tissue at the patient reported site of palpable
abnormality of focal tenderness within the left breast 2 o'clock
position 3 cm from the nipple.
IMPRESSION: Focal palpable abnormality corresponds with benign appearing breast
tissue.

No suspicious mass.

RECOMMENDATION:
Continued clinical evaluation for reported left breast palpable
abnormality.

Screening mammogram at age 40 unless there are persistent or
intervening clinical concerns. (Code:3O-S-BSJ).

I have discussed the findings and recommendations with the patient.
Results were also provided in writing at the conclusion of the
visit. If applicable, a reminder letter will be sent to the patient
regarding the next appointment.

BI-RADS CATEGORY  1: Negative.

## 2016-08-01 ENCOUNTER — Other Ambulatory Visit: Payer: Self-pay | Admitting: Family Medicine

## 2016-12-12 ENCOUNTER — Encounter: Payer: Self-pay | Admitting: Maternal Newborn

## 2016-12-12 ENCOUNTER — Ambulatory Visit (INDEPENDENT_AMBULATORY_CARE_PROVIDER_SITE_OTHER): Payer: BC Managed Care – PPO | Admitting: Maternal Newborn

## 2016-12-12 VITALS — BP 102/70 | Wt 149.0 lb

## 2016-12-12 DIAGNOSIS — O219 Vomiting of pregnancy, unspecified: Secondary | ICD-10-CM

## 2016-12-12 DIAGNOSIS — Z3480 Encounter for supervision of other normal pregnancy, unspecified trimester: Secondary | ICD-10-CM | POA: Insufficient documentation

## 2016-12-12 DIAGNOSIS — Z3401 Encounter for supervision of normal first pregnancy, first trimester: Secondary | ICD-10-CM

## 2016-12-12 NOTE — Patient Instructions (Signed)
First Trimester of Pregnancy The first trimester of pregnancy is from week 1 until the end of week 13 (months 1 through 3). A week after a sperm fertilizes an egg, the egg will implant on the wall of the uterus. This embryo will begin to develop into a baby. Genes from you and your partner will form the baby. The female genes will determine whether the baby will be a boy or a girl. At 6-8 weeks, the eyes and face will be formed, and the heartbeat can be seen on ultrasound. At the end of 12 weeks, all the baby's organs will be formed. Now that you are pregnant, you will want to do everything you can to have a healthy baby. Two of the most important things are to get good prenatal care and to follow your health care provider's instructions. Prenatal care is all the medical care you receive before the baby's birth. This care will help prevent, find, and treat any problems during the pregnancy and childbirth. Body changes during your first trimester Your body goes through many changes during pregnancy. The changes vary from woman to woman.  You may gain or lose a couple of pounds at first.  You may feel sick to your stomach (nauseous) and you may throw up (vomit). If the vomiting is uncontrollable, call your health care provider.  You may tire easily.  You may develop headaches that can be relieved by medicines. All medicines should be approved by your health care provider.  You may urinate more often. Painful urination may mean you have a bladder infection.  You may develop heartburn as a result of your pregnancy.  You may develop constipation because certain hormones are causing the muscles that push stool through your intestines to slow down.  You may develop hemorrhoids or swollen veins (varicose veins).  Your breasts may begin to grow larger and become tender. Your nipples may stick out more, and the tissue that surrounds them (areola) may become darker.  Your gums may bleed and may be  sensitive to brushing and flossing.  Dark spots or blotches (chloasma, mask of pregnancy) may develop on your face. This will likely fade after the baby is born.  Your menstrual periods will stop.  You may have a loss of appetite.  You may develop cravings for certain kinds of food.  You may have changes in your emotions from day to day, such as being excited to be pregnant or being concerned that something may go wrong with the pregnancy and baby.  You may have more vivid and strange dreams.  You may have changes in your hair. These can include thickening of your hair, rapid growth, and changes in texture. Some women also have hair loss during or after pregnancy, or hair that feels dry or thin. Your hair will most likely return to normal after your baby is born.  What to expect at prenatal visits During a routine prenatal visit:  You will be weighed to make sure you and the baby are growing normally.  Your blood pressure will be taken.  Your abdomen will be measured to track your baby's growth.  The fetal heartbeat will be listened to between weeks 10 and 14 of your pregnancy.  Test results from any previous visits will be discussed.  Your health care provider may ask you:  How you are feeling.  If you are feeling the baby move.  If you have had any abnormal symptoms, such as leaking fluid, bleeding, severe headaches,   or abdominal cramping.  If you are using any tobacco products, including cigarettes, chewing tobacco, and electronic cigarettes.  If you have any questions.  Other tests that may be performed during your first trimester include:  Blood tests to find your blood type and to check for the presence of any previous infections. The tests will also be used to check for low iron levels (anemia) and protein on red blood cells (Rh antibodies). Depending on your risk factors, or if you previously had diabetes during pregnancy, you may have tests to check for high blood  sugar that affects pregnant women (gestational diabetes).  Urine tests to check for infections, diabetes, or protein in the urine.  An ultrasound to confirm the proper growth and development of the baby.  Fetal screens for spinal cord problems (spina bifida) and Down syndrome.  HIV (human immunodeficiency virus) testing. Routine prenatal testing includes screening for HIV, unless you choose not to have this test.  You may need other tests to make sure you and the baby are doing well.  Follow these instructions at home: Medicines  Follow your health care provider's instructions regarding medicine use. Specific medicines may be either safe or unsafe to take during pregnancy.  Take a prenatal vitamin that contains at least 600 micrograms (mcg) of folic acid.  If you develop constipation, try taking a stool softener if your health care provider approves. Eating and drinking  Eat a balanced diet that includes fresh fruits and vegetables, whole grains, good sources of protein such as meat, eggs, or tofu, and low-fat dairy. Your health care provider will help you determine the amount of weight gain that is right for you.  Avoid raw meat and uncooked cheese. These carry germs that can cause birth defects in the baby.  Eating four or five small meals rather than three large meals a day may help relieve nausea and vomiting. If you start to feel nauseous, eating a few soda crackers can be helpful. Drinking liquids between meals, instead of during meals, also seems to help ease nausea and vomiting.  Limit foods that are high in fat and processed sugars, such as fried and sweet foods.  To prevent constipation: ? Eat foods that are high in fiber, such as fresh fruits and vegetables, whole grains, and beans. ? Drink enough fluid to keep your urine clear or pale yellow. Activity  Exercise only as directed by your health care provider. Most women can continue their usual exercise routine during  pregnancy. Try to exercise for 30 minutes at least 5 days a week. Exercising will help you: ? Control your weight. ? Stay in shape. ? Be prepared for labor and delivery.  Experiencing pain or cramping in the lower abdomen or lower back is a good sign that you should stop exercising. Check with your health care provider before continuing with normal exercises.  Try to avoid standing for long periods of time. Move your legs often if you must stand in one place for a long time.  Avoid heavy lifting.  Wear low-heeled shoes and practice good posture.  You may continue to have sex unless your health care provider tells you not to. Relieving pain and discomfort  Wear a good support bra to relieve breast tenderness.  Take warm sitz baths to soothe any pain or discomfort caused by hemorrhoids. Use hemorrhoid cream if your health care provider approves.  Rest with your legs elevated if you have leg cramps or low back pain.  If you develop   varicose veins in your legs, wear support hose. Elevate your feet for 15 minutes, 3-4 times a day. Limit salt in your diet. Prenatal care  Schedule your prenatal visits by the twelfth week of pregnancy. They are usually scheduled monthly at first, then more often in the last 2 months before delivery.  Write down your questions. Take them to your prenatal visits.  Keep all your prenatal visits as told by your health care provider. This is important. Safety  Wear your seat belt at all times when driving.  Make a list of emergency phone numbers, including numbers for family, friends, the hospital, and police and fire departments. General instructions  Ask your health care provider for a referral to a local prenatal education class. Begin classes no later than the beginning of month 6 of your pregnancy.  Ask for help if you have counseling or nutritional needs during pregnancy. Your health care provider can offer advice or refer you to specialists for help  with various needs.  Do not use hot tubs, steam rooms, or saunas.  Do not douche or use tampons or scented sanitary pads.  Do not cross your legs for long periods of time.  Avoid cat litter boxes and soil used by cats. These carry germs that can cause birth defects in the baby and possibly loss of the fetus by miscarriage or stillbirth.  Avoid all smoking, herbs, alcohol, and medicines not prescribed by your health care provider. Chemicals in these products affect the formation and growth of the baby.  Do not use any products that contain nicotine or tobacco, such as cigarettes and e-cigarettes. If you need help quitting, ask your health care provider. You may receive counseling support and other resources to help you quit.  Schedule a dentist appointment. At home, brush your teeth with a soft toothbrush and be gentle when you floss. Contact a health care provider if:  You have dizziness.  You have mild pelvic cramps, pelvic pressure, or nagging pain in the abdominal area.  You have persistent nausea, vomiting, or diarrhea.  You have a bad smelling vaginal discharge.  You have pain when you urinate.  You notice increased swelling in your face, hands, legs, or ankles.  You are exposed to fifth disease or chickenpox.  You are exposed to German measles (rubella) and have never had it. Get help right away if:  You have a fever.  You are leaking fluid from your vagina.  You have spotting or bleeding from your vagina.  You have severe abdominal cramping or pain.  You have rapid weight gain or loss.  You vomit blood or material that looks like coffee grounds.  You develop a severe headache.  You have shortness of breath.  You have any kind of trauma, such as from a fall or a car accident. Summary  The first trimester of pregnancy is from week 1 until the end of week 13 (months 1 through 3).  Your body goes through many changes during pregnancy. The changes vary from  woman to woman.  You will have routine prenatal visits. During those visits, your health care provider will examine you, discuss any test results you may have, and talk with you about how you are feeling. This information is not intended to replace advice given to you by your health care provider. Make sure you discuss any questions you have with your health care provider. Document Released: 01/02/2001 Document Revised: 12/21/2015 Document Reviewed: 12/21/2015 Elsevier Interactive Patient Education  2017 [employer]   Inc[employer]  

## 2016-12-12 NOTE — Progress Notes (Signed)
C/O can't get into a care without getting sick, hard to drive, esp after eating;  LMP unknown d/t polycystic ovaries - spotted in Sept went off bcp around 10/5th, 2018.

## 2016-12-13 ENCOUNTER — Encounter: Payer: Self-pay | Admitting: Maternal Newborn

## 2016-12-13 LAB — RPR+RH+ABO+RUB AB+AB SCR+CB...
ANTIBODY SCREEN: NEGATIVE
HEMATOCRIT: 40.3 % (ref 34.0–46.6)
HEP B S AG: NEGATIVE
HIV Screen 4th Generation wRfx: NONREACTIVE
Hemoglobin: 13.8 g/dL (ref 11.1–15.9)
MCH: 31.1 pg (ref 26.6–33.0)
MCHC: 34.2 g/dL (ref 31.5–35.7)
MCV: 91 fL (ref 79–97)
Platelets: 255 10*3/uL (ref 150–379)
RBC: 4.44 x10E6/uL (ref 3.77–5.28)
RDW: 12.6 % (ref 12.3–15.4)
RH TYPE: NEGATIVE
RPR Ser Ql: NONREACTIVE
Rubella Antibodies, IGG: 1.11 index (ref >0.99)
Varicella zoster IgG: 208 index (ref >165)
WBC: 11.1 10*3/uL — AB (ref 3.4–10.8)

## 2016-12-13 MED ORDER — DOXYLAMINE-PYRIDOXINE 10-10 MG PO TBEC: 2.0000 | DELAYED_RELEASE_TABLET | Freq: Every day | ORAL | 5 refills | Status: DC

## 2016-12-13 NOTE — Progress Notes (Signed)
12/12/2016   Chief Complaint: Positive home pregnancy test November 1, desires prenatal care.  Transfer of Care Patient: no  History of Present Illness: [name] is a 26 y.o. G2P1001  No LMP recorded (lmp unknown). Patient is pregnant., with the above CC.   Her periods were: irregular periods, had some spotting the last week of September/first week of October and stopped birth control pills the first week of October. She was using no method when she conceived.  She has Positive signs or symptoms of nausea/vomiting of pregnancy. She has Negative signs or symptoms of miscarriage or preterm labor She identifies Negative Zika risk factors for her and her partner On any different medications around the time she conceived/early pregnancy: No  History of varicella: Yes, also had vaccine postpartum in 2012  ROS: A 12-point review of systems was performed and negative, except as stated in the above HPI.  OBGYN History: As per HPI. 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 Last pap smear 01/05/2015, NIL.  History of STIs: No   Past Medical History: Past Medical History:  Diagnosis Date  . Dysmenorrhea   . Headache    migraines  . Irregular menses   . Nipple discharge in female    occasional  . Panic attack   . Pilonidal cyst     Past Surgical History: Past Surgical History:  Procedure Laterality Date  . LAPAROSCOPY N/A 01/31/2015   Procedure: LAPAROSCOPY DIAGNOSTIC  WITH PERITONEAL BIOPSIES; LYSIS OF ADHESIONS;  Surgeon: Herold HarmsMartin A Defrancesco, MD;  Location: [facility] ORS;  Service: Gynecology;  Laterality: N/A;  . PILONIDAL CYST EXCISION    . TYMPANOSTOMY TUBE PLACEMENT      Family History:  Family History  Problem Relation Age of Onset  . Cancer Paternal Grandmother        pgm-sister  . Hyperlipidemia Mother   . Cancer Maternal Grandfather        prostate   She denies any female cancers, bleeding or blood clotting disorders.  Her mother's cousin had a baby with Trisomy 4518. She denies any history of intellectual disability, birth defects or genetic disorders in the FOB's history  Social History:  Social History   Socioeconomic History  . Marital status: Single    Spouse name: Not on file  . Number of children: Not on file  . Years of education: Not on file  . Highest education level: Not on file  Social Needs  . Financial resource strain: Not on file  . Food insecurity - worry: Not on file  . Food insecurity - inability: Not on file  . Transportation needs - medical: Not on file  . Transportation needs - non-medical: Not on file  Occupational History  . Not on file  Tobacco  Use  . Smoking status: Former Smoker    Packs/day: 0.25    Years: 2.00    Pack years: 0.50    Types: Cigarettes  . Smokeless tobacco: Never Used  Substance and Sexual Activity  . Alcohol use: No    Alcohol/week: 0.0 oz  . Drug use: No  . Sexual activity: Yes    Birth control/protection: Pill  Other Topics Concern  . Not on file  Social History Narrative  . Not on file   Any cats in the household: Yes, outdoor. Aware to avoid feces, cat litter, and soil where buried waste may be found. Denies history of and current domestic violence.  Allergy: No Known Allergies  Current Outpatient Medications:  Current Outpatient Medications:  .  Doxylamine-Pyridoxine (DICLEGIS) 10-10 MG TBEC, Take 2 tablets by mouth at bedtime. If symptoms persist, add one tablet in  the morning and one in the afternoon, Disp: 100 tablet, Rfl: 5 .  PROAIR HFA 108 (90 Base) MCG/ACT inhaler, INHALE 2 PUFFS EVERY 6 HOURS AS NEEDED FOR WHEEZING/ SHORTNESS OF BREATH (NEED VISIT), Disp: 1 Inhaler, Rfl: 0   Physical Exam:   BP 102/70   Wt 149 lb (67.6 kg)   LMP  (LMP Unknown)   BMI 25.58 kg/m  Body mass index is 25.58 kg/m. Constitutional: Well nourished, well developed female in no acute distress.  Neck:  Supple, normal appearance, and no thyromegaly  Cardiovascular: S1, S2 normal, no murmur, rub or gallop, regular rate and rhythm Respiratory:  Clear to auscultation bilaterally. Normal respiratory effort Abdomen: positive bowel sounds and no masses, hernias; diffusely non tender to palpation, non distended Breasts: breasts appear normal, no suspicious masses, no skin or nipple changes or axillary nodes. Neuro/Psych:  Normal mood and affect.  Skin:  Warm and dry.  Lymphatic:  No inguinal lymphadenopathy.   Pelvic exam: is not limited by body habitus External genitalia, Bartholin's glands, Urethra, Skene's glands: within normal limits Vagina: within normal limits and with no blood in the vault  Cervix: normal appearing cervix without discharge or lesions, closed/long/high Uterus:  enlarged: consistent with pregnancy Adnexa:  normal adnexa  Assessment: [name] is a 26 y.o. G2P1001 Unknown based on No LMP recorded (lmp unknown). Patient is pregnant. with an Estimated Date of Delivery: None noted., presenting for prenatal care.  Plan:  1) Avoid alcoholic beverages. 2) Patient encouraged not to smoke.  3) Discontinue the use of all non-medicinal drugs and chemicals.  4) Take prenatal vitamins daily.  5) Seatbelt use advised 6) Nutrition, food safety (fish, cheese advisories, and high nitrite foods) and exercise discussed. 7) Hospital and practice style delivering [facility] discussed  8) Patient is asked about travel to areas at risk for the Zika virus, and  counseled to avoid travel and exposure to mosquitoes or sexual partners who may have themselves been exposed to the virus. Testing is discussed, and will be ordered as appropriate.  9) [facility] advised 10) Genetic Screening, such as with 1st Trimester Screening, cell free fetal DNA, AFP testing, and Ultrasound, as well as with amniocentesis and CVS as appropriate, is discussed with patient. She plans to have genetic testing this pregnancy. 11) Diclegis prescribed for nausea and vomiting.  Problem list reviewed and updated.  Return in about 1 week (around 12/19/2016) for ROB with ultrasound.  Marcelyn BruinsJacelyn Schmid, CNM [facility], [facility] 12/13/2016  3:57 PM

## 2016-12-14 LAB — URINE CULTURE: ORGANISM ID, BACTERIA: NO GROWTH

## 2016-12-15 LAB — GC/CHLAMYDIA PROBE AMP
CHLAMYDIA, DNA PROBE: NEGATIVE
Neisseria gonorrhoeae by PCR: NEGATIVE

## 2016-12-19 ENCOUNTER — Encounter: Payer: Self-pay | Admitting: Maternal Newborn

## 2016-12-20 ENCOUNTER — Other Ambulatory Visit: Payer: BC Managed Care – PPO

## 2016-12-20 ENCOUNTER — Encounter: Payer: BC Managed Care – PPO | Admitting: Advanced Practice Midwife

## 2016-12-21 ENCOUNTER — Ambulatory Visit (INDEPENDENT_AMBULATORY_CARE_PROVIDER_SITE_OTHER): Payer: BC Managed Care – PPO | Admitting: Advanced Practice Midwife

## 2016-12-21 ENCOUNTER — Ambulatory Visit (INDEPENDENT_AMBULATORY_CARE_PROVIDER_SITE_OTHER): Payer: BC Managed Care – PPO

## 2016-12-21 ENCOUNTER — Encounter: Payer: Self-pay | Admitting: Advanced Practice Midwife

## 2016-12-21 VITALS — BP 90/60 | Wt 152.0 lb

## 2016-12-21 DIAGNOSIS — Z3687 Encounter for antenatal screening for uncertain dates: Secondary | ICD-10-CM

## 2016-12-21 DIAGNOSIS — Z3401 Encounter for supervision of normal first pregnancy, first trimester: Secondary | ICD-10-CM | POA: Diagnosis not present

## 2016-12-21 DIAGNOSIS — Z3A01 Less than 8 weeks gestation of pregnancy: Secondary | ICD-10-CM

## 2016-12-21 NOTE — Patient Instructions (Signed)

## 2016-12-21 NOTE — Progress Notes (Signed)
No [email or web address].rj 

## 2016-12-21 NOTE — Progress Notes (Signed)
  Routine Prenatal Care Visit  Subjective  Amanda Allen is a 26 y.o. G2P1001 at 5835w4d being seen today for ongoing prenatal care.  She is currently monitored for the following issues for this low-risk pregnancy and has Abnormal uterine bleeding; Dysmenorrhea; Dyspareunia; Pelvic peritoneal adhesions, female; and Encounter for supervision of normal first pregnancy in first trimester on their problem list.  ----------------------------------------------------------------------------------- Patient reports no complaints.   Denies vaginal bleeding. Denies leaking of fluid. Denies contractions. ----------------------------------------------------------------------------------- The following portions of the patient's history were reviewed and updated as appropriate: allergies, current medications, past family history, past medical history, past social history, past surgical history and problem list. Problem list updated.   Objective  Blood pressure 90/60, weight 152 lb (68.9 kg). Pregravid weight 139 lb (63 kg) Total Weight Gain 13 lb (5.897 kg) Urinalysis:      Fetal Status: Fetal Heart Rate (bpm): 166         Dating scan today: 7 weeks 4 days, SCH 4.6 x 1.6 cm  General:  Alert, oriented and cooperative. Patient is in no acute distress.  Skin: Skin is warm and dry. No rash noted.   Cardiovascular: Normal heart rate noted  Respiratory: Normal respiratory effort, no problems with respiration noted  Abdomen: Soft, gravid, appropriate for gestational age.       Pelvic:  Cervical exam deferred        Extremities: Normal range of motion.     Mental Status: Normal mood and affect. Normal behavior. Normal judgment and thought content.   Assessment   26 y.o. G2P1001 at 2935w4d by  08/05/2017, by Ultrasound presenting for routine prenatal visit  Plan   SECOND Problems (from 12/12/16 to present)    Problem Noted Resolved   Encounter for supervision of normal first pregnancy in first trimester  12/12/2016 by Oswaldo ConroySchmid, Jacelyn Y, CNM No   Overview Addendum 12/15/2016  9:47 AM by Oswaldo ConroySchmid, Jacelyn Y, CNM    Clinic [facility] Prenatal Labs  Dating  Blood type:     Genetic Screen 1 Screen:    AFP:     Quad:     NIPS: Antibody:   Anatomic US  Rubella:   Varicella:    GTT Early:               Third trimester:  RPR:     Rhogam  HBsAg:     TDaP vaccine                       Flu Shot: HIV:     Baby Food                                GBS:   Contraception  Pap:  CBB     CS/VBAC    Support Person Jared                 Preterm labor symptoms and general obstetric precautions including but not limited to vaginal bleeding, contractions, leaking of fluid and fetal movement were reviewed in detail with the patient. Please refer to After Visit Summary for other counseling recommendations.   Return in about 3 weeks (around 01/11/2017) for rob and informaseq.  Tresea MallJane Kealie Barrie, CNM  12/21/2016 2:26 PM

## 2017-01-02 ENCOUNTER — Encounter: Payer: Self-pay | Admitting: Family Medicine

## 2017-01-02 ENCOUNTER — Ambulatory Visit: Payer: BC Managed Care – PPO | Admitting: Family Medicine

## 2017-01-02 VITALS — BP 102/78 | HR 65 | Temp 98.3°F | Resp 16 | Ht 64.0 in | Wt 151.0 lb

## 2017-01-02 DIAGNOSIS — J020 Streptococcal pharyngitis: Secondary | ICD-10-CM | POA: Diagnosis not present

## 2017-01-02 LAB — POCT RAPID STREP A (OFFICE): RAPID STREP A SCREEN: POSITIVE — AB

## 2017-01-02 MED ORDER — PENICILLIN V POTASSIUM 500 MG PO TABS: 500.0000 mg | ORAL_TABLET | Freq: Four times a day (QID) | ORAL | 0 refills | Status: DC

## 2017-01-02 NOTE — Progress Notes (Signed)
Name: Amanda Allen   MRN: [id number]    DOB: [birth year]   Date:01/02/2017       Progress Note  Subjective  Chief Complaint  Chief Complaint  Patient presents with  . Sore Throat    1 week of ST and drainage with L side neck stiffness adn L and R ear pain.     Sore Throat   This is a new (and preg/9 weeks) problem. The current episode started in the past 7 days (got worse saturday night). The problem has been gradually worsening. Neither side of throat is experiencing more pain than the other. There has been no fever. The pain is at a severity of 6/10. The pain is moderate. Associated symptoms include congestion, ear pain and a hoarse voice. Pertinent negatives include no abdominal pain, coughing, diarrhea, drooling, ear discharge, headaches, plugged ear sensation, neck pain, shortness of breath, stridor, swollen glands, trouble swallowing or vomiting. She has had no exposure to strep or mono. 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 She has tried acetaminophen for the symptoms. The treatment provided mild relief.    No problem-specific Assessment & Plan notes found for this encounter.   Past Medical History:  Diagnosis Date  . Dysmenorrhea   . Headache    migraines  . Irregular menses   . Nipple discharge in female    occasional  . Panic attack   . Pilonidal cyst     Past Surgical History:  Procedure Laterality Date  . LAPAROSCOPY N/A 01/31/2015   Procedure: LAPAROSCOPY DIAGNOSTIC WITH PERITONEAL BIOPSIES; LYSIS OF ADHESIONS;  Surgeon: Herold HarmsMartin A Defrancesco, MD;  Location: [facility] ORS;  Service: Gynecology;  Laterality: N/A;  . PILONIDAL CYST EXCISION    . TYMPANOSTOMY TUBE PLACEMENT      Family History  Problem Relation Age of Onset  . Cancer Paternal Grandmother        pgm-sister  . Hyperlipidemia Mother   . Cancer Maternal Grandfather        prostate    Social History   Socioeconomic History  . Marital status: Single    Spouse name: Not on file  . Number of children:  Not on file  . Years of education: Not on file  . Highest education level: Not on file  Social Needs  . Financial resource strain: Not on file  . Food insecurity - worry: Not on file  . Food insecurity - inability: Not on file  . Transportation needs - medical: Not on file  . Transportation needs - non-medical: Not on file  Occupational History  . Not on file  Tobacco Use  . Smoking status: Former Smoker    Packs/day: 0.25    Years: 2.00    Pack years: 0.50    Types: Cigarettes  . Smokeless tobacco: Never Used  Substance and Sexual Activity  . Alcohol use: No    Alcohol/week: 0.0 oz  . Drug use: No  . Sexual activity: Yes    Birth control/protection: Pill  Other Topics Concern  . Not on file  Social History Narrative  . Not on file    No Known Allergies  Outpatient Medications Prior to Visit  Medication Sig Dispense Refill  . PROAIR HFA 108 (90 Base) MCG/ACT inhaler INHALE 2 PUFFS EVERY 6 HOURS AS NEEDED FOR WHEEZING/ SHORTNESS OF BREATH (NEED VISIT) 1 Inhaler 0  . Doxylamine-Pyridoxine (DICLEGIS) 10-10 MG TBEC Take 2 tablets by mouth at bedtime. If symptoms persist, add one tablet in the morning and one in the  afternoon 100 tablet 5   No facility-administered medications prior to visit.     Review of Systems  Constitutional: Negative for chills, fever, malaise/fatigue and weight loss.  HENT: Positive for congestion, ear pain and hoarse voice. Negative for drooling, ear discharge, sore throat and trouble swallowing.   Eyes: Negative for blurred vision.  Respiratory: Negative for cough, sputum production, shortness of breath, wheezing and stridor.   Cardiovascular: Negative for chest pain, palpitations and leg swelling.  Gastrointestinal: Negative for abdominal pain, blood in stool, constipation, diarrhea, heartburn, melena, nausea and vomiting.  Genitourinary: Negative for dysuria, frequency, hematuria and urgency.  Musculoskeletal: Negative for back pain, joint pain,  myalgias and neck pain.  Skin: Negative for rash.  Neurological: Negative for dizziness, tingling, sensory change, focal weakness and headaches.  Endo/Heme/Allergies: Negative for environmental allergies and polydipsia. Does not bruise/bleed easily.  Psychiatric/Behavioral: Negative for depression and suicidal ideas. The patient is not nervous/anxious and does not have insomnia.      Objective  Vitals:   01/02/17 1135  BP: 102/78  Pulse: 65  Resp: 16  Temp: 98.3 F (36.8 C)  SpO2: 98%  Weight: 151 lb (68.5 kg)  Height: 5\' 4"  (1.626 m)    Physical Exam  Constitutional: She is well-developed, well-nourished, and in no distress. No distress.  HENT:  Head: Normocephalic and atraumatic.  Right Ear: Tympanic membrane and external ear normal.  Left Ear: Tympanic membrane and external ear normal.  Nose: Nose normal.  Mouth/Throat: Posterior oropharyngeal erythema present. No oropharyngeal exudate, posterior oropharyngeal edema or tonsillar abscesses.  Eyes: Conjunctivae and EOM are normal. Pupils are equal, round, and reactive to light. Right eye exhibits no discharge. Left eye exhibits no discharge.  Neck: Normal range of motion. Neck supple. No JVD present. No thyromegaly present.  Cardiovascular: Normal rate, regular rhythm, normal heart sounds and intact distal pulses. Exam reveals no gallop and no friction rub.  No murmur heard. Pulmonary/Chest: Effort normal and breath sounds normal.  Abdominal: Soft. Bowel sounds are normal. She exhibits no mass. There is no hepatosplenomegaly. There is no tenderness. There is no guarding.  Musculoskeletal: Normal range of motion. She exhibits no edema.  Lymphadenopathy:       Head (right side): Submandibular adenopathy present.       Head (left side): Submandibular adenopathy present.    She has no cervical adenopathy.  tender  Neurological: She is alert. She has normal reflexes.  Skin: Skin is warm and dry. She is not diaphoretic.   Psychiatric: Mood and affect normal.  Nursing note and vitals reviewed.     Assessment & Plan  Problem List Items Addressed This Visit    None    Visit Diagnoses    Pharyngitis, streptococcal    -  Primary   Relevant Medications   penicillin v potassium (VEETID) 500 MG tablet   Other Relevant Orders   POCT rapid strep A (Completed)      Meds ordered this encounter  Medications  . penicillin v potassium (VEETID) 500 MG tablet    Sig: Take 1 tablet (500 mg total) by mouth 4 (four) times daily.    Dispense:  40 tablet    Refill:  0      Dr. [facility]reanna Jones [facility]  01/02/17

## 2017-01-08 ENCOUNTER — Encounter: Payer: Self-pay | Admitting: Obstetrics and Gynecology

## 2017-01-08 ENCOUNTER — Ambulatory Visit (INDEPENDENT_AMBULATORY_CARE_PROVIDER_SITE_OTHER): Payer: BC Managed Care – PPO | Admitting: Obstetrics and Gynecology

## 2017-01-08 VITALS — BP 110/64 | Wt 156.0 lb

## 2017-01-08 DIAGNOSIS — Z1379 Encounter for other screening for genetic and chromosomal anomalies: Secondary | ICD-10-CM

## 2017-01-08 DIAGNOSIS — Z3A10 10 weeks gestation of pregnancy: Secondary | ICD-10-CM

## 2017-01-08 DIAGNOSIS — Z3401 Encounter for supervision of normal first pregnancy, first trimester: Secondary | ICD-10-CM

## 2017-01-08 NOTE — Progress Notes (Signed)
  Routine Prenatal Care Visit  Subjective  Amanda Allen is a 26 y.o. G2P1001 at 3330w1d being seen today for ongoing prenatal care.  She is currently monitored for the following issues for this low-risk pregnancy and has Abnormal uterine bleeding; Dysmenorrhea; Dyspareunia; Pelvic peritoneal adhesions, female; and Encounter for supervision of normal first pregnancy in first trimester on their problem list.  ----------------------------------------------------------------------------------- Patient reports no complaints.    . Vag. Bleeding: None.   . Denies leaking of fluid.  ----------------------------------------------------------------------------------- The following portions of the patient's history were reviewed and updated as appropriate: allergies, current medications, past family history, past medical history, past social history, past surgical history and problem list. Problem list updated.   Objective  Blood pressure 110/64, weight 156 lb (70.8 kg). Pregravid weight 139 lb (63 kg) Total Weight Gain 17 lb (7.711 kg) Urinalysis:      Fetal Status:           General:  Alert, oriented and cooperative. Patient is in no acute distress.  Skin: Skin is warm and dry. No rash noted.   Cardiovascular: Normal heart rate noted  Respiratory: Normal respiratory effort, no problems with respiration noted  Abdomen: Soft, gravid, appropriate for gestational age. Pain/Pressure: Absent     Pelvic:  Cervical exam deferred        Extremities: Normal range of motion.     Mental Status: Normal mood and affect. Normal behavior. Normal judgment and thought content.   Assessment   [age] y.o. G2P1001 at 9630w1d by  08/05/2017, by Ultrasound presenting for routine prenatal visit  Plan   SECOND Problems (from 12/12/16 to present)    Problem Noted Resolved   Encounter for supervision of normal first pregnancy in first trimester 12/12/2016 by Oswaldo ConroySchmid, Jacelyn Y, CNM No   Overview Addendum 12/21/2016   2:32 PM by Tresea MallGledhill, Jane, CNM    Clinic [facility] Prenatal Labs  Dating  Blood type:     Genetic Screen 1 Screen:    AFP:     Quad:     NIPS: Antibody:   Anatomic US  Rubella:   Varicella:    GTT Early:               Third trimester:  RPR:     Rhogam  HBsAg:     TDaP vaccine                       Flu Shot: HIV:     Baby Food                                GBS:   Contraception  Pap:  CBB     CS/VBAC    Support Person Jared                 Preterm labor symptoms and general obstetric precautions including but not limited to vaginal bleeding, contractions, leaking of fluid and fetal movement were reviewed in detail with the patient. Please refer to After Visit Summary for other counseling recommendations.   Return in about 4 weeks (around 02/05/2017) for Routine Prenatal Appointment.  NIPT today with sex determination.   Thomasene MohairStephen Eriana Suliman, MD  01/08/2017 3:32 PM

## 2017-01-11 ENCOUNTER — Encounter: Payer: BC Managed Care – PPO | Admitting: Obstetrics and Gynecology

## 2017-01-13 LAB — MATERNIT 21 PLUS CORE, BLOOD
Chromosome 13: NEGATIVE
Chromosome 18: NEGATIVE
Chromosome 21: NEGATIVE
Y CHROMOSOME: NOT DETECTED

## 2017-01-14 ENCOUNTER — Telehealth: Payer: Self-pay | Admitting: Obstetrics and Gynecology

## 2017-01-14 NOTE — Telephone Encounter (Signed)
Discussed normal results. All questions answered. Gender revealed to husband only per patient's request.

## 2017-01-22 NOTE — L&D Delivery Note (Signed)
Delivery Note Primary OB: [facility] Delivery Physician: Amanda MajorPaul Lyndol Vanderheiden, MD Gestational Age: Full term Antepartum complications: none Intrapartum complications: None  A viable Female was delivered via vertex perentation. Amanda Allen Apgars:8 ,9  Weight:  7 lb 15 oz .   Placenta status: spontaneous and Intact.  Cord: 3+ vessels;  with the following complications: nuchal.  Anesthesia:  epidural Episiotomy:  none Lacerations:  periuretheral and small vaginal Suture Repair: 2.0 vicryl Est. Blood Loss (mL):  200 mL  Mom to postpartum.  Baby to Couplet care / Skin to Skin.  Amanda MajorPaul Amanda Pancoast, MD, Amanda FrederickFACOG [facility], [facility] 08/08/2017  8:47 PM [phone]

## 2017-02-04 ENCOUNTER — Telehealth: Payer: Self-pay

## 2017-02-04 NOTE — Telephone Encounter (Signed)
PT has called after hour triage line stating she is 14 weeks and is having Cough,nasal congestion, and headache. She was wanting to know what to take.  I called patient, after consulting with AMS, and told her he said as long as the main ingredient is Guafinasine medication should be fine. She states she has an appointment tomorrow for ROB and will follow up at that time.   KJ CMA

## 2017-02-05 ENCOUNTER — Ambulatory Visit (INDEPENDENT_AMBULATORY_CARE_PROVIDER_SITE_OTHER): Payer: BC Managed Care – PPO | Admitting: Obstetrics and Gynecology

## 2017-02-05 ENCOUNTER — Encounter: Payer: Self-pay | Admitting: Obstetrics and Gynecology

## 2017-02-05 VITALS — BP 118/74 | Wt 156.0 lb

## 2017-02-05 DIAGNOSIS — Z3A14 14 weeks gestation of pregnancy: Secondary | ICD-10-CM

## 2017-02-05 DIAGNOSIS — Z3480 Encounter for supervision of other normal pregnancy, unspecified trimester: Secondary | ICD-10-CM

## 2017-02-05 NOTE — Progress Notes (Signed)
  Routine Prenatal Care Visit  Subjective  Amanda Allen is a 27 y.o. G2P1001 at 2866w1d being seen today for ongoing prenatal care.  She is currently monitored for the following issues for this low-risk pregnancy and has Abnormal uterine bleeding; Dysmenorrhea; Dyspareunia; Pelvic peritoneal adhesions, female; and Supervision of other normal pregnancy, antepartum on their problem list.  ----------------------------------------------------------------------------------- Patient reports no complaints.    . Vag. Bleeding: None.  Movement: Absent. Denies leaking of fluid.  ----------------------------------------------------------------------------------- The following portions of the patient's history were reviewed and updated as appropriate: allergies, current medications, past family history, past medical history, past social history, past surgical history and problem list. Problem list updated.   Objective  Blood pressure 118/74, weight 156 lb (70.8 kg). Pregravid weight 139 lb (63 kg) Total Weight Gain 17 lb (7.711 kg) Urinalysis: Urine Protein: Negative Urine Glucose: Negative  Fetal Status: Fetal Heart Rate (bpm): 152   Movement: Absent     General:  Alert, oriented and cooperative. Patient is in no acute distress.  Skin: Skin is warm and dry. No rash noted.   Cardiovascular: Normal heart rate noted  Respiratory: Normal respiratory effort, no problems with respiration noted  Abdomen: Soft, gravid, appropriate for gestational age. Pain/Pressure: Absent     Pelvic:  Cervical exam deferred        Extremities: Normal range of motion.     Mental Status: Normal mood and affect. Normal behavior. Normal judgment and thought content.   Assessment   27 y.o. G2P1001 at 366w1d by  08/05/2017, by Ultrasound presenting for routine prenatal visit  Plan   SECOND Problems (from 12/12/16 to present)    Problem Noted Resolved   Supervision of other normal pregnancy, antepartum 12/12/2016 by  Oswaldo ConroySchmid, Jacelyn Y, CNM No   Overview Addendum 12/21/2016  2:32 PM by Tresea MallGledhill, Jane, CNM    Clinic [facility] Prenatal Labs  Dating  Blood type:     Genetic Screen 1 Screen:    AFP:     Quad:     NIPS: Antibody:   Anatomic US  Rubella:   Varicella:    GTT Early:               Third trimester:  RPR:     Rhogam  HBsAg:     TDaP vaccine                       Flu Shot: HIV:     Baby Food                                GBS:   Contraception  Pap:  CBB     CS/VBAC    Support Person Jared               Please refer to After Visit Summary for other counseling recommendations.   Return in about 5 weeks (around 03/12/2017) for anatomy u/s and routine prenatal.  Thomasene MohairStephen Jackson, MD  02/05/2017 4:40 PM

## 2017-02-21 ENCOUNTER — Telehealth: Payer: Self-pay

## 2017-02-21 NOTE — Telephone Encounter (Signed)
Yes if that is the diagnosis then parvovirus IgG IgM get sent but even if positive there isn't necessarily much we can do different.  If the IgG is negative and the IgM is negative we can recheck in a few weeks

## 2017-02-21 NOTE — Telephone Encounter (Signed)
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 She is taking her son for apt today at 3 for it. Pt inquiring if there is anything she needs to do or be concerned about.

## 2017-02-22 NOTE — Telephone Encounter (Signed)
Spoke w/pt @3 :01 pm 02/21/17. Notified of Labs that can be ordered if she desires. Pt will contact us if she wishes to have drawn.

## 2017-03-12 ENCOUNTER — Ambulatory Visit (INDEPENDENT_AMBULATORY_CARE_PROVIDER_SITE_OTHER): Payer: BC Managed Care – PPO | Admitting: Obstetrics and Gynecology

## 2017-03-12 ENCOUNTER — Ambulatory Visit (INDEPENDENT_AMBULATORY_CARE_PROVIDER_SITE_OTHER): Payer: BC Managed Care – PPO

## 2017-03-12 VITALS — BP 100/56 | Wt 162.0 lb

## 2017-03-12 DIAGNOSIS — Z3480 Encounter for supervision of other normal pregnancy, unspecified trimester: Secondary | ICD-10-CM

## 2017-03-12 DIAGNOSIS — Z3A19 19 weeks gestation of pregnancy: Secondary | ICD-10-CM

## 2017-03-12 NOTE — Progress Notes (Signed)
ROB Anatomy scan today/ IT'S a GIRL

## 2017-03-13 NOTE — Progress Notes (Signed)
Routine Prenatal Care Visit  Subjective  Amanda Allen is a 27 y.o. G2P1001 at [redacted]w[redacted]d being seen today for ongoing prenatal care.  She is currently monitored for the following issues for this low-risk pregnancy and has Abnormal uterine bleeding; Dysmenorrhea; Dyspareunia; Pelvic peritoneal adhesions, female; and Supervision of other normal pregnancy, antepartum on their problem list.  ----------------------------------------------------------------------------------- Patient reports no complaints.    .  .   . Denies leaking of fluid.  ----------------------------------------------------------------------------------- The following portions of the patient's history were reviewed and updated as appropriate: allergies, current medications, past family history, past medical history, past social history, past surgical history and problem list. Problem list updated.   Objective  Blood pressure (!) 100/56, weight 162 lb (73.5 kg). Pregravid weight 139 lb (63 kg) Total Weight Gain 23 lb (10.4 kg) Urinalysis: Urine Protein: Negative Urine Glucose: Negative  Fetal Status:           General:  Alert, oriented and cooperative. Patient is in no acute distress.  Skin: Skin is warm and dry. No rash noted.   Cardiovascular: Normal heart rate noted  Respiratory: Normal respiratory effort, no problems with respiration noted  Abdomen: Soft, gravid, appropriate for gestational age.       Pelvic:  Cervical exam deferred        Extremities: Normal range of motion.     ental Status: Normal mood and affect. Normal behavior. Normal judgment and thought content.   US Ob Comp + 14 Wk  Result Date: 03/13/2017 ULTRASOUND REPORT Location: [facility] Date of Service: 03/12/2017 Indications:Anatomy U/S Findings: Mason Jim intrauterine pregnancy is visualized with FHR at 142 bpm. Biometrics give an (U/S) Gestational age of [redacted]w[redacted]d and an (U/S) EDD of 08/06/2017; this correlates with the clinically established  Estimated Date of Delivery: 08/05/17 Fetal presentation is Cephalic. EFW: 10oz. Placenta: Anterior, Grade 1. AFI: subjectively normal. Anatomic survey is complete and normal; Gender - female.  Right Ovary is normal in appearance. Left Ovary is normal appearance. Survey of the adnexa demonstrates no adnexal masses. There is no free peritoneal fluid in the cul de sac. Impression: 1. [redacted]w[redacted]d Viable Singleton Intrauterine pregnancy by U/S. 2. (U/S) EDD is consistent with Clinically established Estimated Date of Delivery: 08/05/17 . 3. Normal Anatomy Scan Recommendations: 1.Clinical correlation with the patient's History and Physical Exam. Willette Alma, RDMS, RVT  There is a singleton gestation with subjectively normal amniotic fluid volume. The fetal biometry correlates with established dating. Detailed evaluation of the fetal anatomy was performed.The fetal anatomical survey appears within normal limits within the resolution of ultrasound as described above.  It must be noted that a normal ultrasound is unable to rule out fetal aneuploidy.  Vena Austria, MD, Evern Core [facility], [facility] 03/13/2017, 1:19 PM    There is no immunization history on file for this patient.   Assessment   27 y.o. G2P1001 at [redacted]w[redacted]d by  08/05/2017, by Ultrasound presenting for routine prenatal visit  Plan   SECOND Problems (from 12/12/16 to present)    Problem Noted Resolved   Supervision of other normal pregnancy, antepartum 12/12/2016 by Oswaldo Conroy, CNM No   Overview Addendum 03/13/2017  1:23 PM by Vena Austria, MD    Clinic [facility] Prenatal Labs  Dating 7 week [place] Blood type: B/Negative/-- (11/21 1532)   Genetic Informaseq XX Antibody:Negative (11/21 1532)  Anatomic [place] Normal Female Rubella: 1.11 (11/21 1532) Varicella:    GTT  RPR: Non Reactive (11/21 1532)   Rhogam N/A  HBsAg: Negative (11/21 1532)   TDaP vaccine                       Flu Shot: HIV: Non Reactive (11/21 1532)   Baby  Food                                GBS:   Contraception  Pap: 01/05/2015 NIL  CBB     CS/VBAC    Support Person Jilda PandaJared               Gestational age appropriate obstetric precautions including but not limited to vaginal bleeding, contractions, leaking of fluid and fetal movement were reviewed in detail with the patient.   - normal anatomy scan today  Return in about 4 weeks (around 04/09/2017) for ROB.  Vena AustriaAndreas Khyle Goodell, MD, Evern CoreFACOG [facility], [facility] 03/13/2017, 1:19 PM

## 2017-04-03 ENCOUNTER — Ambulatory Visit: Payer: BC Managed Care – PPO | Admitting: Family Medicine

## 2017-04-03 ENCOUNTER — Encounter: Payer: Self-pay | Admitting: Family Medicine

## 2017-04-03 VITALS — BP 102/78 | HR 78 | Temp 98.0°F | Resp 16 | Ht 64.0 in | Wt 166.2 lb

## 2017-04-03 DIAGNOSIS — B0222 Postherpetic trigeminal neuralgia: Secondary | ICD-10-CM

## 2017-04-03 DIAGNOSIS — B0231 Zoster conjunctivitis: Secondary | ICD-10-CM | POA: Diagnosis not present

## 2017-04-03 MED ORDER — VALACYCLOVIR HCL 1 G PO TABS: 1000.0000 mg | ORAL_TABLET | Freq: Three times a day (TID) | ORAL | 0 refills | Status: DC

## 2017-04-03 NOTE — Progress Notes (Signed)
Name: Amanda GrillsCarly Ann Schnoor   MRN: [id number]    DOB: 1990-12-16   Date:04/03/2017       Progress Note  Subjective  Chief Complaint  Chief Complaint  Patient presents with  . Conjunctivitis    Started yesterday : Right eye redness and itching with some in L eye very mild. R side of face hurts at ear lobe feels bump and at chin near cheek feels like a bruise almost but under skin. Jaw hurts to touch and chew at gum line. Was exposed to pink eye last week.   Marland Kitchen. Headache    also exposed to Flu last week son had...now having headaches similar to migraine since Monday.     Conjunctivitis   The current episode started yesterday. The onset was gradual. The problem has been gradually worsening. The problem is moderate. Nothing relieves the symptoms. Associated symptoms include ear pain, headaches, eye discharge, eye pain and eye redness. Pertinent negatives include no fever, no decreased vision, no double vision, no eye itching, no photophobia, no abdominal pain, no constipation, no diarrhea, no nausea, no congestion, no ear discharge, no hearing loss, no mouth sores, no rhinorrhea, no sore throat, no stridor, no swollen glands, no neck pain, no cough, no URI, no wheezing and no rash. The eye pain is severe.  Headache   This is a new problem. The current episode started in the past 7 days (monday). The problem occurs intermittently. The problem has been gradually worsening. The pain is located in the right unilateral and retro-orbital (along mandibular) region. The pain does not radiate. Associated symptoms include ear pain, eye pain and eye redness. Pertinent negatives include no abdominal pain, back pain, blurred vision, coughing, dizziness, fever, hearing loss, insomnia, nausea, neck pain, photophobia, rhinorrhea, sore throat, swollen glands, tingling or weight loss.    No problem-specific Assessment & Plan notes found for this encounter.   Past Medical History:  Diagnosis Date  . Dysmenorrhea   .  Headache    migraines  . Irregular menses   . Nipple discharge in female    occasional  . Panic attack   . Pilonidal cyst     Past Surgical History:  Procedure Laterality Date  . LAPAROSCOPY N/A 01/31/2015   Procedure: LAPAROSCOPY DIAGNOSTIC WITH PERITONEAL BIOPSIES; LYSIS OF ADHESIONS;  Surgeon: Herold HarmsMartin A Defrancesco, MD;  Location: [facility] ORS;  Service: Gynecology;  Laterality: N/A;  . PILONIDAL CYST EXCISION    . TYMPANOSTOMY TUBE PLACEMENT      Family History  Problem Relation Age of Onset  . Cancer Paternal Grandmother        pgm-sister  . Hyperlipidemia Mother   . Cancer Maternal Grandfather        prostate    Social History   Socioeconomic History  . Marital status: Married    Spouse name: Not on file  . Number of children: Not on file  . Years of education: Not on file  . Highest education level: Not on file  Social Needs  . Financial resource strain: Not on file  . Food insecurity - worry: Not on file  . Food insecurity - inability: Not on file  . Transportation needs - medical: Not on file  . Transportation needs - non-medical: Not on file  Occupational History  . Not on file  Tobacco Use  . Smoking status: Former Smoker    Packs/day: 0.25    Years: 2.00    Pack years: 0.50    Types: Cigarettes  . Smokeless  tobacco: Never Used  Substance and Sexual Activity  . Alcohol use: No    Alcohol/week: 0.0 oz  . Drug use: No  . Sexual activity: Yes    Birth control/protection: Pill  Other Topics Concern  . Not on file  Social History Narrative  . Not on file    No Known Allergies  No outpatient medications prior to visit.   No facility-administered medications prior to visit.     Review of Systems  Constitutional: Negative for chills, fever, malaise/fatigue and weight loss.  HENT: Positive for ear pain. Negative for congestion, ear discharge, hearing loss, mouth sores, rhinorrhea and sore throat.   Eyes: Positive for pain, discharge and redness.  Negative for blurred vision, double vision, photophobia and itching.  Respiratory: Negative for cough, sputum production, shortness of breath, wheezing and stridor.   Cardiovascular: Negative for chest pain, palpitations and leg swelling.  Gastrointestinal: Negative for abdominal pain, blood in stool, constipation, diarrhea, heartburn, melena and nausea.  Genitourinary: Negative for dysuria, frequency, hematuria and urgency.  Musculoskeletal: Negative for back pain, joint pain, myalgias and neck pain.  Skin: Negative for rash.  Neurological: Positive for headaches. Negative for dizziness, tingling, sensory change and focal weakness.  Endo/Heme/Allergies: Negative for environmental allergies and polydipsia. Does not bruise/bleed easily.  Psychiatric/Behavioral: Negative for depression and suicidal ideas. The patient is not nervous/anxious and does not have insomnia.      Objective  Vitals:   04/03/17 1525  BP: 102/78  Pulse: 78  Resp: 16  Temp: 98 F (36.7 C)  SpO2: 100%  Weight: 166 lb 3.2 oz (75.4 kg)  Height: 5\' 4"  (1.626 m)    Physical Exam  Constitutional: She is well-developed, well-nourished, and in no distress. No distress.  HENT:  Head: Normocephalic and atraumatic.  Right Ear: Tympanic membrane, external ear and ear canal normal.  Left Ear: Tympanic membrane, external ear and ear canal normal.  Nose: Nose normal.  Mouth/Throat: Uvula is midline, oropharynx is clear and moist and mucous membranes are normal.  Eyes: EOM are normal. Pupils are equal, round, and reactive to light. Right eye exhibits discharge. Left eye exhibits discharge. Right conjunctiva is injected. Left conjunctiva is injected.  Neck: Normal range of motion. Neck supple. No JVD present. No thyromegaly present.  Cardiovascular: Normal rate, regular rhythm, normal heart sounds and intact distal pulses. Exam reveals no gallop and no friction rub.  No murmur heard. Pulmonary/Chest: Effort normal and breath  sounds normal. She has no wheezes. She has no rales.  Abdominal: Soft. Bowel sounds are normal. She exhibits no mass. There is no tenderness. There is no guarding.  Musculoskeletal: Normal range of motion. She exhibits no edema.  Lymphadenopathy:    She has no cervical adenopathy.  Neurological: She is alert. She has normal reflexes.  Skin: Skin is warm and dry. She is not diaphoretic.  Psychiatric: Mood and affect normal.  Nursing note and vitals reviewed.     Assessment & Plan  Problem List Items Addressed This Visit    None    Visit Diagnoses    Zoster conjunctivitis    -  Primary   Relevant Medications   valACYclovir (VALTREX) 1000 MG tablet   Other Relevant Orders   Ambulatory referral to Ophthalmology   Acute trigeminal herpes zoster       discussed with Dr Jean Rosenthal   Relevant Medications   valACYclovir (VALTREX) 1000 MG tablet      Meds ordered this encounter  Medications  . valACYclovir (VALTREX)  1000 MG tablet    Sig: Take 1 tablet (1,000 mg total) by mouth 3 (three) times daily.    Dispense:  21 tablet    Refill:  0      Dr. [facility]  04/03/17

## 2017-04-10 ENCOUNTER — Ambulatory Visit (INDEPENDENT_AMBULATORY_CARE_PROVIDER_SITE_OTHER): Payer: BC Managed Care – PPO | Admitting: Obstetrics and Gynecology

## 2017-04-10 VITALS — BP 112/66 | Wt 172.0 lb

## 2017-04-10 DIAGNOSIS — Z3A23 23 weeks gestation of pregnancy: Secondary | ICD-10-CM

## 2017-04-10 DIAGNOSIS — Z3480 Encounter for supervision of other normal pregnancy, unspecified trimester: Secondary | ICD-10-CM

## 2017-04-10 NOTE — Progress Notes (Signed)
ROB Has had Shingles and pink eye within the past week

## 2017-04-10 NOTE — Progress Notes (Signed)
    Routine Prenatal Care Visit  Subjective  Amanda Allen is a 27 y.o. G2P1001 at 2367w2d being seen today for ongoing prenatal care.  She is currently monitored for the following issues for this low-risk pregnancy and has Abnormal uterine bleeding; Dysmenorrhea; Pelvic peritoneal adhesions, female; and Supervision of other normal pregnancy, antepartum on their problem list.  ----------------------------------------------------------------------------------- Patient reports no complaints.   Contractions: Not present. Vag. Bleeding: None.  Movement: Present. Denies leaking of fluid.  ----------------------------------------------------------------------------------- The following portions of the patient's history were reviewed and updated as appropriate: allergies, current medications, past family history, past medical history, past social history, past surgical history and problem list. Problem list updated.   Objective  Blood pressure 112/66, weight 172 lb (78 kg). Pregravid weight 139 lb (63 kg) Total Weight Gain 33 lb (15 kg) Urinalysis:      Fetal Status: Fetal Heart Rate (bpm): 150   Movement: Present     General:  Alert, oriented and cooperative. Patient is in no acute distress.  Skin: Skin is warm and dry. No rash noted.   Cardiovascular: Normal heart rate noted  Respiratory: Normal respiratory effort, no problems with respiration noted  Abdomen: Soft, gravid, appropriate for gestational age. Pain/Pressure: Absent     Pelvic:  Cervical exam deferred        Extremities: Normal range of motion.     ental Status: Normal mood and affect. Normal behavior. Normal judgment and thought content.     Assessment   27 y.o. G2P1001 at 6867w2d by  08/05/2017, by Ultrasound presenting for routine prenatal visit  Plan   SECOND Problems (from 12/12/16 to present)    Problem Noted Resolved   Supervision of other normal pregnancy, antepartum 12/12/2016 by Oswaldo ConroySchmid, Jacelyn Y, CNM No   Overview  Addendum 03/13/2017  1:23 PM by Vena AustriaStaebler, Saskia Simerson, MD    [facility]inic [facility] Prenatal Labs  Dating 7 week US Blood type: B/Negative/-- (11/21 1532)   Genetic Informaseq XX Antibody:Negative (11/21 1532)  Anatomic US Normal Female Rubella: 1.11 (11/21 1532) Varicella: immune  GTT  RPR: Non Reactive (11/21 1532)   Rhogam N/A HBsAg: Negative (11/21 1532)   TDaP vaccine                       Flu Shot: HIV: Non Reactive (11/21 1532)   Baby Food                                GBS:   Contraception  Pap: 01/05/2015 NIL  CBB     CS/VBAC    Support Person Jilda PandaJared                 Gestational age appropriate obstetric precautions including but not limited to vaginal bleeding, contractions, leaking of fluid and fetal movement were reviewed in detail with the patient.   - discussed low risk of congenital varicella with shingles and varicella re-activation given maternal antibodies present  Return in about 4 weeks (around 05/08/2017) for rob and 28 week labs.  Vena AustriaAndreas Lovel Suazo, MD, Evern CoreFACOG [facility], [facility] 04/10/2017, 4:11 PM

## 2017-04-29 ENCOUNTER — Telehealth: Payer: Self-pay

## 2017-04-29 NOTE — Telephone Encounter (Signed)
Pt states she has swelling , H/A, seeing spots for the past week. Pt aware she needs to be seen .

## 2017-04-30 ENCOUNTER — Ambulatory Visit (INDEPENDENT_AMBULATORY_CARE_PROVIDER_SITE_OTHER): Payer: BC Managed Care – PPO | Admitting: Maternal Newborn

## 2017-04-30 VITALS — BP 120/70 | Wt 178.0 lb

## 2017-04-30 DIAGNOSIS — O26892 Other specified pregnancy related conditions, second trimester: Secondary | ICD-10-CM

## 2017-04-30 DIAGNOSIS — H539 Unspecified visual disturbance: Secondary | ICD-10-CM

## 2017-04-30 DIAGNOSIS — Z3480 Encounter for supervision of other normal pregnancy, unspecified trimester: Secondary | ICD-10-CM

## 2017-04-30 DIAGNOSIS — R51 Headache: Secondary | ICD-10-CM

## 2017-04-30 DIAGNOSIS — O1202 Gestational edema, second trimester: Secondary | ICD-10-CM

## 2017-04-30 MED ORDER — BUTALBITAL-APAP-CAFFEINE 50-325-40 MG PO CAPS: 1.0000 | ORAL_CAPSULE | Freq: Four times a day (QID) | ORAL | 3 refills | Status: DC | PRN

## 2017-04-30 NOTE — Progress Notes (Signed)
C/o headaches have turned into migraines; swelling is worse; heartburn is worse - it felt like a heart attack the other day. rj

## 2017-05-01 LAB — COMPREHENSIVE METABOLIC PANEL
ALK PHOS: 74 IU/L (ref 39–117)
ALT: 12 IU/L (ref 0–32)
AST: 16 IU/L (ref 0–40)
Albumin/Globulin Ratio: 1.5 (ref 1.2–2.2)
Albumin: 3.8 g/dL (ref 3.5–5.5)
BUN / CREAT RATIO: 9 (ref 9–23)
BUN: 6 mg/dL (ref 6–20)
Bilirubin Total: <0.2 mg/dL (ref 0.0–1.2)
CO2: 20 mmol/L (ref 20–29)
Calcium: 9.2 mg/dL (ref 8.7–10.2)
Chloride: 106 mmol/L (ref 96–106)
Creatinine, Ser: 0.67 mg/dL (ref 0.57–1.00)
GFR calc Af Amer: 140 mL/min/{1.73_m2} (ref >59)
GFR, EST NON AFRICAN AMERICAN: 122 mL/min/{1.73_m2} (ref >59)
GLOBULIN, TOTAL: 2.5 g/dL (ref 1.5–4.5)
Glucose: 83 mg/dL (ref 65–99)
Potassium: 3.6 mmol/L (ref 3.5–5.2)
SODIUM: 140 mmol/L (ref 134–144)
Total Protein: 6.3 g/dL (ref 6.0–8.5)

## 2017-05-01 LAB — CBC
HEMATOCRIT: 34.9 % (ref 34.0–46.6)
Hemoglobin: 11.9 g/dL (ref 11.1–15.9)
MCH: 31.1 pg (ref 26.6–33.0)
MCHC: 34.1 g/dL (ref 31.5–35.7)
MCV: 91 fL (ref 79–97)
PLATELETS: 220 10*3/uL (ref 150–379)
RBC: 3.83 x10E6/uL (ref 3.77–5.28)
RDW: 13.3 % (ref 12.3–15.4)
WBC: 9.5 10*3/uL (ref 3.4–10.8)

## 2017-05-01 LAB — PROTEIN / CREATININE RATIO, URINE: CREATININE, UR: 19.6 mg/dL

## 2017-05-03 ENCOUNTER — Encounter: Payer: Self-pay | Admitting: Maternal Newborn

## 2017-05-03 NOTE — Progress Notes (Signed)
    Prenatal Problem Visit  Subjective  Amanda Allen is a 27 y.o. G2P1001 at 5243w4d being seen today for ongoing prenatal care.  She is currently monitored for the following issues for this low-risk pregnancy and has Abnormal uterine bleeding; Dysmenorrhea; Pelvic peritoneal adhesions, female; and Supervision of other normal pregnancy, antepartum on their problem list.  ----------------------------------------------------------------------------------- Patient reports headache, heartburn and edema in hands and feet.  Occasionally sees some spots in her vision. ----------------------------------------------------------------------------------- The following portions of the patient's history were reviewed and updated as appropriate: allergies, current medications, past family history, past medical history, past social history, past surgical history and problem list. Problem list updated.   Objective  Blood pressure 120/70, weight 178 lb (80.7 kg). Pregravid weight 139 lb (63 kg) Total Weight Gain 39 lb (17.7 kg)   General:  Alert, oriented and cooperative. Patient is in no acute distress.  Skin: Skin is warm and dry. No rash noted.   Cardiovascular: Normal heart rate noted  Respiratory: Normal respiratory effort, no problems with respiration noted  Abdomen: Soft, gravid, appropriate for gestational age.       Pelvic:  Cervical exam deferred        Extremities: Normal range of motion.     Mental Status: Normal mood and affect. Normal behavior. Normal judgment and thought content.   Non-pitting pedal edema, also in both hands.  Assessment   27 y.o. G2P1001 at 1243w4d, EDD 08/05/2017 by Ultrasound presenting for a prenatal problem visit.  Plan   SECOND Problems (from 12/12/16 to present)    Problem Noted Resolved   Supervision of other normal pregnancy, antepartum 12/12/2016 by Oswaldo ConroySchmid, Jacelyn Y, CNM No   Overview Addendum 03/13/2017  1:23 PM by Vena AustriaStaebler, Andreas, MD    [facility]  Prenatal Labs  Dating 7 week US Blood type: B/Negative/-- (11/21 1532)   Genetic Informaseq XX Antibody:Negative (11/21 1532)  Anatomic US Normal Female Rubella: 1.11 (11/21 1532) Varicella:    GTT  RPR: Non Reactive (11/21 1532)   Rhogam N/A HBsAg: Negative (11/21 1532)   TDaP vaccine                       Flu Shot: HIV: Non Reactive (11/21 1532)   Baby Food                                GBS:   Contraception  Pap: 01/05/2015 NIL  CBB     CS/VBAC    Support Person Jilda PandaJared              Will order labs today to rule out pre-eclampsia. Although she is normotensive and her symptoms may have other causes, the combination of headache, edema, and visual changes warrants investigation. Now taking medication for heartburn.  Keep scheduled ROB appointment.  Marcelyn BruinsJacelyn Schmid, CNM 05/03/2017  8:15 AM

## 2017-05-09 ENCOUNTER — Other Ambulatory Visit: Payer: BC Managed Care – PPO

## 2017-05-09 ENCOUNTER — Ambulatory Visit (INDEPENDENT_AMBULATORY_CARE_PROVIDER_SITE_OTHER): Payer: BC Managed Care – PPO | Admitting: Certified Nurse Midwife

## 2017-05-09 VITALS — BP 102/58 | Wt 174.0 lb

## 2017-05-09 DIAGNOSIS — Z3480 Encounter for supervision of other normal pregnancy, unspecified trimester: Secondary | ICD-10-CM

## 2017-05-09 DIAGNOSIS — O26899 Other specified pregnancy related conditions, unspecified trimester: Secondary | ICD-10-CM | POA: Diagnosis not present

## 2017-05-09 DIAGNOSIS — Z3A27 27 weeks gestation of pregnancy: Secondary | ICD-10-CM

## 2017-05-09 DIAGNOSIS — Z6791 Unspecified blood type, Rh negative: Secondary | ICD-10-CM

## 2017-05-09 DIAGNOSIS — R51 Headache: Secondary | ICD-10-CM

## 2017-05-09 DIAGNOSIS — Z3A23 23 weeks gestation of pregnancy: Secondary | ICD-10-CM

## 2017-05-09 MED ORDER — RHO D IMMUNE GLOBULIN 1500 UNIT/2ML IJ SOSY
300.0000 ug | PREFILLED_SYRINGE | Freq: Once | INTRAMUSCULAR | Status: AC
  Administered 2017-05-09: 300 ug via INTRAMUSCULAR

## 2017-05-09 NOTE — Progress Notes (Signed)
ROB 28 weeks lab

## 2017-05-10 LAB — 28 WEEKS RH-PANEL
Antibody Screen: NEGATIVE
BASOS ABS: 0 10*3/uL (ref 0.0–0.2)
Basos: 0 %
EOS (ABSOLUTE): 0.1 10*3/uL (ref 0.0–0.4)
Eos: 1 %
Gestational Diabetes Screen: 116 mg/dL (ref 65–139)
HEMATOCRIT: 34 % (ref 34.0–46.6)
HIV Screen 4th Generation wRfx: NONREACTIVE
Hemoglobin: 11.7 g/dL (ref 11.1–15.9)
IMMATURE GRANS (ABS): 0.2 10*3/uL — AB (ref 0.0–0.1)
IMMATURE GRANULOCYTES: 2 %
LYMPHS ABS: 2.2 10*3/uL (ref 0.7–3.1)
LYMPHS: 21 %
MCH: 31 pg (ref 26.6–33.0)
MCHC: 34.4 g/dL (ref 31.5–35.7)
MCV: 90 fL (ref 79–97)
MONOS ABS: 0.8 10*3/uL (ref 0.1–0.9)
Monocytes: 7 %
Neutrophils Absolute: 7.3 10*3/uL — ABNORMAL HIGH (ref 1.4–7.0)
Neutrophils: 69 %
PLATELETS: 238 10*3/uL (ref 150–379)
RBC: 3.77 x10E6/uL (ref 3.77–5.28)
RDW: 13.3 % (ref 12.3–15.4)
RPR Ser Ql: NONREACTIVE
WBC: 10.6 10*3/uL (ref 3.4–10.8)

## 2017-05-11 DIAGNOSIS — R51 Headache: Secondary | ICD-10-CM

## 2017-05-11 DIAGNOSIS — O26899 Other specified pregnancy related conditions, unspecified trimester: Secondary | ICD-10-CM | POA: Insufficient documentation

## 2017-05-11 NOTE — Progress Notes (Signed)
ROB at 27wk3d and 28 week labs: Baby active. Still having headaches urelieved with Tylenol. Insurance did not Engineer, watercover Fioricet. The headache is around her head. PIH labs 4/9 were WNL. O: BP 102/58, negative proteinuria, FHT WNL.  Blood type: B neg A: POssible tension headaches Rh negative P: Rhogam given after 28 week labs drawn Discussed CCBB Encouraged to try Excedrin Tension/ massage and heat to neck and occipital area

## 2017-05-22 ENCOUNTER — Ambulatory Visit (INDEPENDENT_AMBULATORY_CARE_PROVIDER_SITE_OTHER): Payer: BC Managed Care – PPO | Admitting: Obstetrics and Gynecology

## 2017-05-22 ENCOUNTER — Encounter: Payer: Self-pay | Admitting: Obstetrics and Gynecology

## 2017-05-22 VITALS — BP 118/74 | Wt 182.0 lb

## 2017-05-22 DIAGNOSIS — O26899 Other specified pregnancy related conditions, unspecified trimester: Secondary | ICD-10-CM

## 2017-05-22 DIAGNOSIS — R51 Headache: Secondary | ICD-10-CM

## 2017-05-22 DIAGNOSIS — R519 Headache, unspecified: Secondary | ICD-10-CM

## 2017-05-22 DIAGNOSIS — Z3A29 29 weeks gestation of pregnancy: Secondary | ICD-10-CM

## 2017-05-22 DIAGNOSIS — Z3480 Encounter for supervision of other normal pregnancy, unspecified trimester: Secondary | ICD-10-CM

## 2017-05-22 NOTE — Progress Notes (Incomplete)
  Routine Prenatal Care Visit  Subjective  Amanda Allen is a 27 y.o. G2P1001 at [redacted]w[redacted]d being seen today for ongoing prenatal care.  She is currently monitored for the following issues for this {Blank single:19197::"high-risk","low-risk"} pregnancy and has Abnormal uterine bleeding; Dysmenorrhea; Pelvic peritoneal adhesions, female; Supervision of other normal pregnancy, antepartum; and Headache in pregnancy, antepartum on their problem list.  ----------------------------------------------------------------------------------- Patient reports {sx:14538}.   Contractions: Not present. Vag. Bleeding: None.  Movement: Present. Denies leaking of fluid.  ----------------------------------------------------------------------------------- The following portions of the patient's history were reviewed and updated as appropriate: allergies, current medications, past family history, past medical history, past social history, past surgical history and problem list. Problem list updated.   Objective  Blood pressure 118/74, weight 182 lb (82.6 kg). Pregravid weight 139 lb (63 kg) Total Weight Gain 43 lb (19.5 kg) Urinalysis: Urine Protein: Negative Urine Glucose: Negative  Fetal Status: Fetal Heart Rate (bpm): 140 Fundal Height: 30 cm Movement: Present     General:  Alert, oriented and cooperative. Patient is in no acute distress.  Skin: Skin is warm and dry. No rash noted.   Cardiovascular: Normal heart rate noted  Respiratory: Normal respiratory effort, no problems with respiration noted  Abdomen: Soft, gravid, appropriate for gestational age. Pain/Pressure: Absent     Pelvic:  {Blank single:19197::"Cervical exam performed","Cervical exam deferred"}        Extremities: Normal range of motion.     Mental Status: Normal mood and affect. Normal behavior. Normal judgment and thought content.   Assessment   27 y.o. G2P1001 at [redacted]w[redacted]d by  08/05/2017, by Ultrasound presenting for {Blank  single:19197::"routine","work-in"} prenatal visit  Plan   SECOND Problems (from 12/12/16 to present)    Problem Noted Resolved   Supervision of other normal pregnancy, antepartum 12/12/2016 by Oswaldo Conroy, CNM No   Overview Addendum 05/20/2017  7:52 AM by Vena Austria, MD    [facility] Prenatal Labs  Dating 7 week [place] Blood type: B/Negative/-- (11/21 1532)   Genetic Informaseq XX Antibody:Negative (11/21 1532)  Anatomic [place] Normal Female Rubella: 1.11 (11/21 1532) Varicella:    GTT 116 RPR: Non Reactive (11/21 1532)   Rhogam N/A HBsAg: Negative (11/21 1532)   TDaP vaccine                       Flu Shot: HIV: Non Reactive (11/21 1532)   Baby Food                                GBS:   Contraception  Pap: 01/05/2015 NIL  CBB     CS/VBAC    Support Person Jared                 {Blank single:19197::"Term","Preterm"} labor symptoms and general obstetric precautions including but not limited to vaginal bleeding, contractions, leaking of fluid and fetal movement were reviewed in detail with the patient. Please refer to After Visit Summary for other counseling recommendations.   Return in about 2 weeks (around 06/05/2017) for Routine Prenatal Appointment.  Thomasene Mohair, MD, [facility], [facility] 05/22/2017 4:30 PM  No vb. No lof. TDAP nv

## 2017-05-22 NOTE — Progress Notes (Signed)
  Routine Prenatal Care Visit  Subjective  Amanda Allen is a 27 y.o. G2P1001 at [redacted]w[redacted]d being seen today for ongoing prenatal care.  She is currently monitored for the following issues for this low-risk pregnancy and has Abnormal uterine bleeding; Dysmenorrhea; Pelvic peritoneal adhesions, female; Supervision of other normal pregnancy, antepartum; and Headache in pregnancy, antepartum on their problem list.  ----------------------------------------------------------------------------------- Patient reports no complaints.   Contractions: Not present. Vag. Bleeding: None.  Movement: Present. Denies leaking of fluid.  ----------------------------------------------------------------------------------- The following portions of the patient's history were reviewed and updated as appropriate: allergies, current medications, past family history, past medical history, past social history, past surgical history and problem list. Problem list updated.   Objective  Blood pressure 118/74, weight 182 lb (82.6 kg). Pregravid weight 139 lb (63 kg) Total Weight Gain 43 lb (19.5 kg) Urinalysis: Urine Protein: Negative Urine Glucose: Negative  Fetal Status: Fetal Heart Rate (bpm): 140 Fundal Height: 30 cm Movement: Present     General:  Alert, oriented and cooperative. Patient is in no acute distress.  Skin: Skin is warm and dry. No rash noted.   Cardiovascular: Normal heart rate noted  Respiratory: Normal respiratory effort, no problems with respiration noted  Abdomen: Soft, gravid, appropriate for gestational age. Pain/Pressure: Absent     Pelvic:  Cervical exam deferred        Extremities: Normal range of motion.     Mental Status: Normal mood and affect. Normal behavior. Normal judgment and thought content.   Assessment   27 y.o. G2P1001 at [redacted]w[redacted]d by  08/05/2017, by Ultrasound presenting for routine prenatal visit  Plan   SECOND Problems (from 12/12/16 to present)    Problem Noted Resolved   Supervision of other normal pregnancy, antepartum 12/12/2016 by Oswaldo Conroy, CNM No   Overview Addendum 05/20/2017  7:52 AM by Vena Austria, MD    [facility] Prenatal Labs  Dating 7 week [place] Blood type: B/Negative/-- (11/21 1532)   Genetic Informaseq XX Antibody:Negative (11/21 1532)  Anatomic [place] Normal Female Rubella: 1.11 (11/21 1532) Varicella:    GTT 116 RPR: Non Reactive (11/21 1532)   Rhogam N/A HBsAg: Negative (11/21 1532)   TDaP vaccine                       Flu Shot: HIV: Non Reactive (11/21 1532)   Baby Food                                GBS:   Contraception  Pap: 01/05/2015 NIL  CBB     CS/VBAC    Support Person Jared                 Preterm labor symptoms and general obstetric precautions including but not limited to vaginal bleeding, contractions, leaking of fluid and fetal movement were reviewed in detail with the patient. Please refer to After Visit Summary for other counseling recommendations.   - discussed contraception, birthing classes Return in about 2 weeks (around 06/05/2017) for Routine Prenatal Appointment.  Thomasene Mohair, MD, [facility], [facility] 05/22/2017 4:30 PM

## 2017-06-05 ENCOUNTER — Ambulatory Visit (INDEPENDENT_AMBULATORY_CARE_PROVIDER_SITE_OTHER): Payer: BC Managed Care – PPO | Admitting: Obstetrics and Gynecology

## 2017-06-05 VITALS — BP 108/62 | Wt 184.0 lb

## 2017-06-05 DIAGNOSIS — Z3480 Encounter for supervision of other normal pregnancy, unspecified trimester: Secondary | ICD-10-CM

## 2017-06-05 DIAGNOSIS — Z3A31 31 weeks gestation of pregnancy: Secondary | ICD-10-CM

## 2017-06-05 DIAGNOSIS — Z23 Encounter for immunization: Secondary | ICD-10-CM | POA: Diagnosis not present

## 2017-06-05 NOTE — Progress Notes (Signed)
Pt reports no problems. tdap and blood consent today.

## 2017-06-05 NOTE — Progress Notes (Signed)
    Routine Prenatal Care Visit  Subjective  Amanda Allen is a 27 y.o. G2P1001 at [redacted]w[redacted]d being seen today for ongoing prenatal care.  She is currently monitored for the following issues for this low-risk pregnancy and has Abnormal uterine bleeding; Dysmenorrhea; Pelvic peritoneal adhesions, female; Supervision of other normal pregnancy, antepartum; and Headache in pregnancy, antepartum on their problem list.  ----------------------------------------------------------------------------------- Patient reports no complaints.   Contractions: Not present. Vag. Bleeding: None.  Movement: Present. Denies leaking of fluid.  ----------------------------------------------------------------------------------- The following portions of the patient's history were reviewed and updated as appropriate: allergies, current medications, past family history, past medical history, past social history, past surgical history and problem list. Problem list updated.   Objective  Blood pressure 108/62, weight 184 lb (83.5 kg). Pregravid weight 139 lb (63 kg) Total Weight Gain 45 lb (20.4 kg) Urinalysis: Urine Protein: Negative Urine Glucose: Negative  Fetal Status: Fetal Heart Rate (bpm): 150 Fundal Height: 32 cm Movement: Present     General:  Alert, oriented and cooperative. Patient is in no acute distress.  Skin: Skin is warm and dry. No rash noted.   Cardiovascular: Normal heart rate noted  Respiratory: Normal respiratory effort, no problems with respiration noted  Abdomen: Soft, gravid, appropriate for gestational age. Pain/Pressure: Absent     Pelvic:  Cervical exam deferred        Extremities: Normal range of motion.     ental Status: Normal mood and affect. Normal behavior. Normal judgment and thought content.     Assessment   27 y.o. G2P1001 at [redacted]w[redacted]d by  08/05/2017, by Ultrasound presenting for routine prenatal visit  Plan   SECOND Problems (from 12/12/16 to present)    Problem Noted Resolved   Supervision of other normal pregnancy, antepartum 12/12/2016 by Oswaldo Conroy, CNM No   Overview Addendum 06/05/2017  4:58 PM by Natale Milch, MD    [facility] Prenatal Labs  Dating 7 week [place] Blood type: B/Negative/-- (11/21 1532)   Genetic Informaseq XX Antibody:Negative (11/21 1532)  Anatomic [place] Normal Female Rubella: 1.11 (11/21 1532) Varicella:    GTT 116 RPR: Non Reactive (11/21 1532)   Rhogam N/A HBsAg: Negative (11/21 1532)   TDaP vaccine   06/05/17                     Flu Shot: HIV: Non Reactive (11/21 1532)   Baby Food   Breast                             GBS:   Contraception  Undecided Pap: 01/05/2015 NIL  CBB   Given information   CS/VBAC    Support Person Jilda Panda                 Gestational age appropriate obstetric precautions including but not limited to vaginal bleeding, contractions, leaking of fluid and fetal movement were reviewed in detail with the patient.    Tdap today. Given information on prenatal classes with [facility].  Given information on birthcontrol options postpartum. Recommended [email or web address].  Given information on cord blood banking. Discussed breast feeding. Patient is planning on breast feeding. Patient was given resources and lactation consultation was advised.   Return in about 2 weeks (around 06/19/2017) for ROB.  Adelene Idler MD [facility], [facility] 06/05/17 5:00 PM

## 2017-06-12 ENCOUNTER — Telehealth: Payer: Self-pay

## 2017-06-12 NOTE — Telephone Encounter (Signed)
Pt states she has been having some pretty strong ctx that started last night & died off in the middle of the day and her back started hurting. She is having some inconsistent ctx now. Pt calling for advice. [phone]

## 2017-06-12 NOTE — Telephone Encounter (Signed)
Send to ER

## 2017-06-12 NOTE — Telephone Encounter (Signed)
Spoke w/pt. Inquired if she timed ctx. She states she did not. They lasted about an hour last night and subsided after lying down but started again this morning 1 q 20 mins or so. Eased off thru the day. Still q  20 mins. Denies LOF, VB. States she is well hydrated and wasn't on her feet any more than usual yesterday. +FM. Advised schedules for today a full but will check w/provider to see if can be seen. Pt is 4 mins away. Pt aware to report to [facility] for monitoring if 4 ctx within 1 hr, any LOF, VB or decreased fetal movement.

## 2017-06-20 ENCOUNTER — Ambulatory Visit (INDEPENDENT_AMBULATORY_CARE_PROVIDER_SITE_OTHER): Payer: BC Managed Care – PPO | Admitting: Obstetrics and Gynecology

## 2017-06-20 ENCOUNTER — Encounter: Payer: Self-pay | Admitting: Obstetrics and Gynecology

## 2017-06-20 VITALS — BP 112/62 | Wt 190.0 lb

## 2017-06-20 DIAGNOSIS — Z3480 Encounter for supervision of other normal pregnancy, unspecified trimester: Secondary | ICD-10-CM

## 2017-06-20 DIAGNOSIS — Z3A33 33 weeks gestation of pregnancy: Secondary | ICD-10-CM

## 2017-06-20 NOTE — Progress Notes (Signed)
  Routine Prenatal Care Visit  Subjective  Amanda Allen is a 27 y.o. G2P1001 at [redacted]w[redacted]d being seen today for ongoing prenatal care.  She is currently monitored for the following issues for this low-risk pregnancy and has Abnormal uterine bleeding; Dysmenorrhea; Pelvic peritoneal adhesions, female; Supervision of other normal pregnancy, antepartum; and Headache in pregnancy, antepartum on their problem list.  ----------------------------------------------------------------------------------- Patient reports no complaints.   Contractions: Not present. Vag. Bleeding: None.  Movement: Present. Denies leaking of fluid.  ----------------------------------------------------------------------------------- The following portions of the patient's history were reviewed and updated as appropriate: allergies, current medications, past family history, past medical history, past social history, past surgical history and problem list. Problem list updated.   Objective  Blood pressure 112/62, weight 190 lb (86.2 kg). Pregravid weight 139 lb (63 kg) Total Weight Gain 51 lb (23.1 kg) Urinalysis: Urine Protein: Negative Urine Glucose: Negative  Fetal Status: Fetal Heart Rate (bpm): 150 Fundal Height: 35 cm Movement: Present     General:  Alert, oriented and cooperative. Patient is in no acute distress.  Skin: Skin is warm and dry. No rash noted.   Cardiovascular: Normal heart rate noted  Respiratory: Normal respiratory effort, no problems with respiration noted  Abdomen: Soft, gravid, appropriate for gestational age. Pain/Pressure: Absent     Pelvic:  Cervical exam deferred        Extremities: Normal range of motion.     Mental Status: Normal mood and affect. Normal behavior. Normal judgment and thought content.   Assessment   27 y.o. G2P1001 at [redacted]w[redacted]d by  08/05/2017, by Ultrasound presenting for routine prenatal visit  Plan   SECOND Problems (from 12/12/16 to present)    Problem Noted Resolved   Supervision of other normal pregnancy, antepartum 12/12/2016 by Oswaldo Conroy, CNM No   Overview Addendum 06/05/2017  4:58 PM by Natale Milch, MD    Clinic [facility] Prenatal Labs  Dating 7 week [place] Blood type: B/Negative/-- (11/21 1532)   Genetic Informaseq XX Antibody:Negative (11/21 1532)  Anatomic [place] Normal Female Rubella: 1.11 (11/21 1532) Varicella:    GTT 116 RPR: Non Reactive (11/21 1532)   Rhogam N/A HBsAg: Negative (11/21 1532)   TDaP vaccine   06/05/17                     Flu Shot: HIV: Non Reactive (11/21 1532)   Baby Food   Breast                             GBS:   Contraception  Undecided Pap: 01/05/2015 NIL  CBB   Given information   CS/VBAC    Support Person Jilda Panda                 Term labor symptoms and general obstetric precautions including but not limited to vaginal bleeding, contractions, leaking of fluid and fetal movement were reviewed in detail with the patient. Please refer to After Visit Summary for other counseling recommendations.   Return in about 2 weeks (around 07/04/2017) for Routine Prenatal Appointment.  Thomasene Mohair, MD, [facility], [facility] 06/20/2017 4:36 PM

## 2017-06-29 ENCOUNTER — Other Ambulatory Visit: Payer: Self-pay

## 2017-06-29 ENCOUNTER — Observation Stay
Admission: EM | Admit: 2017-06-29 | Discharge: 2017-06-30 | Disposition: A | Discharge status: Home / Self Care | Payer: BC Managed Care – PPO | Attending: Obstetrics and Gynecology | Admitting: Obstetrics and Gynecology

## 2017-06-29 DIAGNOSIS — R109 Unspecified abdominal pain: Secondary | ICD-10-CM | POA: Diagnosis not present

## 2017-06-29 DIAGNOSIS — F410 Panic disorder [episodic paroxysmal anxiety] without agoraphobia: Secondary | ICD-10-CM | POA: Insufficient documentation

## 2017-06-29 DIAGNOSIS — G43909 Migraine, unspecified, not intractable, without status migrainosus: Secondary | ICD-10-CM | POA: Insufficient documentation

## 2017-06-29 DIAGNOSIS — Z87891 Personal history of nicotine dependence: Secondary | ICD-10-CM | POA: Insufficient documentation

## 2017-06-29 DIAGNOSIS — Z3A34 34 weeks gestation of pregnancy: Secondary | ICD-10-CM | POA: Insufficient documentation

## 2017-06-29 DIAGNOSIS — O26893 Other specified pregnancy related conditions, third trimester: Secondary | ICD-10-CM | POA: Diagnosis present

## 2017-06-30 DIAGNOSIS — O26893 Other specified pregnancy related conditions, third trimester: Secondary | ICD-10-CM | POA: Diagnosis not present

## 2017-06-30 MED ORDER — RHO D IMMUNE GLOBULIN 1500 UNIT/2ML IJ SOSY
300.0000 ug | PREFILLED_SYRINGE | Freq: Once | INTRAMUSCULAR | Status: DC
  Filled 2017-06-30 (×2): qty 2

## 2017-06-30 NOTE — Discharge Instructions (Signed)
Call [facility] Monday morning 07/01/17 to schedule your Rhogam Injection.

## 2017-06-30 NOTE — Discharge Summary (Signed)
See Final Progress note 
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
## 2017-07-01 ENCOUNTER — Ambulatory Visit (INDEPENDENT_AMBULATORY_CARE_PROVIDER_SITE_OTHER): Payer: BC Managed Care – PPO

## 2017-07-01 DIAGNOSIS — S3760XD Unspecified injury of uterus, subsequent encounter: Secondary | ICD-10-CM

## 2017-07-01 MED ORDER — RHO D IMMUNE GLOBULIN 1500 UNIT/2ML IJ SOSY: 300.0000 ug | PREFILLED_SYRINGE | Freq: Once | INTRAMUSCULAR | 0 refills | Status: DC

## 2017-07-01 MED ORDER — RHO D IMMUNE GLOBULIN 1500 UNIT/2ML IJ SOSY
300.0000 ug | PREFILLED_SYRINGE | Freq: Once | INTRAMUSCULAR | Status: AC
  Administered 2017-07-01: 300 ug via INTRAMUSCULAR

## 2017-07-04 ENCOUNTER — Encounter: Payer: Self-pay | Admitting: Obstetrics and Gynecology

## 2017-07-04 ENCOUNTER — Ambulatory Visit (INDEPENDENT_AMBULATORY_CARE_PROVIDER_SITE_OTHER): Payer: BC Managed Care – PPO | Admitting: Obstetrics and Gynecology

## 2017-07-04 VITALS — BP 112/62 | Wt 193.0 lb

## 2017-07-04 DIAGNOSIS — Z3483 Encounter for supervision of other normal pregnancy, third trimester: Secondary | ICD-10-CM

## 2017-07-04 DIAGNOSIS — Z3A35 35 weeks gestation of pregnancy: Secondary | ICD-10-CM

## 2017-07-04 DIAGNOSIS — Z3480 Encounter for supervision of other normal pregnancy, unspecified trimester: Secondary | ICD-10-CM

## 2017-07-04 NOTE — Progress Notes (Signed)
  Routine Prenatal Care Visit  Subjective  Amanda Allen is a 27 y.o. G2P1001 at 5583w3d being seen today for ongoing prenatal care.  She is currently monitored for the following issues for this low-risk pregnancy and has Abnormal uterine bleeding; Dysmenorrhea; Pelvic peritoneal adhesions, female; Supervision of other normal pregnancy, antepartum; Headache in pregnancy, antepartum; and Labor and delivery, indication for care on their problem list.  ----------------------------------------------------------------------------------- Patient reports no complaints.   Contractions: Not present. Vag. Bleeding: None.  Movement: Present. Denies leaking of fluid.  ----------------------------------------------------------------------------------- The following portions of the patient's history were reviewed and updated as appropriate: allergies, current medications, past family history, past medical history, past social history, past surgical history and problem list. Problem list updated.   Objective  Blood pressure 112/62, weight 193 lb (87.5 kg). Pregravid weight 139 lb (63 kg) Total Weight Gain 54 lb (24.5 kg) Urinalysis: Urine Protein: Negative Urine Glucose: Negative  Fetal Status: Fetal Heart Rate (bpm): 130 Fundal Height: 36 cm Movement: Present     General:  Alert, oriented and cooperative. Patient is in no acute distress.  Skin: Skin is warm and dry. No rash noted.   Cardiovascular: Normal heart rate noted  Respiratory: Normal respiratory effort, no problems with respiration noted  Abdomen: Soft, gravid, appropriate for gestational age. Pain/Pressure: Absent     Pelvic:  Cervical exam deferred        Extremities: Normal range of motion.     Mental Status: Normal mood and affect. Normal behavior. Normal judgment and thought content.   Assessment   27 y.o. G2P1001 at 8783w3d by  08/05/2017, by Ultrasound presenting for routine prenatal visit  Plan   SECOND Problems (from 12/12/16 to  present)    Problem Noted Resolved   Supervision of other normal pregnancy, antepartum 12/12/2016 by Oswaldo ConroySchmid, Jacelyn Y, CNM No   Overview Addendum 06/05/2017  4:58 PM by Natale MilchSchuman, Christanna R, MD    Clinic [facility] Prenatal Labs  Dating 7 week US Blood type: B/Negative/-- (11/21 1532)   Genetic Informaseq XX Antibody:Negative (11/21 1532)  Anatomic US Normal Female Rubella: 1.11 (11/21 1532) Varicella:    GTT 116 RPR: Non Reactive (11/21 1532)   Rhogam N/A HBsAg: Negative (11/21 1532)   TDaP vaccine   06/05/17                     Flu Shot: HIV: Non Reactive (11/21 1532)   Baby Food   Breast                             GBS:   Contraception  Undecided Pap: 01/05/2015 NIL  CBB   Given information   CS/VBAC    Support Person Jared               Preterm labor symptoms and general obstetric precautions including but not limited to vaginal bleeding, contractions, leaking of fluid and fetal movement were reviewed in detail with the patient. Please refer to After Visit Summary for other counseling recommendations.   Return in about 1 week (around 07/11/2017) for Routine Prenatal Appointment.  Thomasene MohairStephen Blythe Veach, MD, Merlinda FrederickFACOG [facility], [facility] 07/04/2017 2:14 PM

## 2017-07-04 NOTE — Progress Notes (Incomplete)
  Routine Prenatal Care Visit  Subjective  Amanda Allen is a 27 y.o. G2P1001 at 7164w3d being seen today for ongoing prenatal care.  She is currently monitored for the following issues for this {Blank single:19197::"high-risk","low-risk"} pregnancy and has Abnormal uterine bleeding; Dysmenorrhea; Pelvic peritoneal adhesions, female; Supervision of other normal pregnancy, antepartum; Headache in pregnancy, antepartum; and Labor and delivery, indication for care on their problem list.  ----------------------------------------------------------------------------------- Patient reports {sx:14538}.   Contractions: Not present. Vag. Bleeding: None.  Movement: Present. Denies leaking of fluid.  ----------------------------------------------------------------------------------- The following portions of the patient's history were reviewed and updated as appropriate: allergies, current medications, past family history, past medical history, past social history, past surgical history and problem list. Problem list updated.   Objective  Blood pressure 112/62, weight 193 lb (87.5 kg). Pregravid weight 139 lb (63 kg) Total Weight Gain 54 lb (24.5 kg) Urinalysis: Urine Protein: Negative Urine Glucose: Negative  Fetal Status: Fetal Heart Rate (bpm): 130 Fundal Height: 36 cm Movement: Present     General:  Alert, oriented and cooperative. Patient is in no acute distress.  Skin: Skin is warm and dry. No rash noted.   Cardiovascular: Normal heart rate noted  Respiratory: Normal respiratory effort, no problems with respiration noted  Abdomen: Soft, gravid, appropriate for gestational age. Pain/Pressure: Absent     Pelvic:  {Blank single:19197::"Cervical exam performed","Cervical exam deferred"}        Extremities: Normal range of motion.     Mental Status: Normal mood and affect. Normal behavior. Normal judgment and thought content.   Assessment   27 y.o. G2P1001 at 8064w3d by  08/05/2017, by Ultrasound  presenting for {Blank single:19197::"routine","work-in"} prenatal visit  Plan   SECOND Problems (from 12/12/16 to present)    Problem Noted Resolved   Supervision of other normal pregnancy, antepartum 12/12/2016 by Oswaldo ConroySchmid, Jacelyn Y, CNM No   Overview Addendum 06/05/2017  4:58 PM by Natale MilchSchuman, Christanna R, MD    Clinic [facility] Prenatal Labs  Dating 7 week US Blood type: B/Negative/-- (11/21 1532)   Genetic Informaseq XX Antibody:Negative (11/21 1532)  Anatomic US Normal Female Rubella: 1.11 (11/21 1532) Varicella:    GTT 116 RPR: Non Reactive (11/21 1532)   Rhogam N/A HBsAg: Negative (11/21 1532)   TDaP vaccine   06/05/17                     Flu Shot: HIV: Non Reactive (11/21 1532)   Baby Food   Breast                             GBS:   Contraception  Undecided Pap: 01/05/2015 NIL  CBB   Given information   CS/VBAC    Support Person Jared                 {Blank single:19197::"Term","Preterm"} labor symptoms and general obstetric precautions including but not limited to vaginal bleeding, contractions, leaking of fluid and fetal movement were reviewed in detail with the patient. Please refer to After Visit Summary for other counseling recommendations.   Return in about 1 week (around 07/11/2017) for Routine Prenatal Appointment.  Thomasene MohairStephen Corney Knighton, MD, Merlinda FrederickFACOG [facility], [facility] 07/04/2017 2:14 PM

## 2017-07-15 ENCOUNTER — Encounter: Payer: Self-pay | Admitting: Obstetrics and Gynecology

## 2017-07-15 ENCOUNTER — Ambulatory Visit (INDEPENDENT_AMBULATORY_CARE_PROVIDER_SITE_OTHER): Payer: BC Managed Care – PPO | Admitting: Obstetrics and Gynecology

## 2017-07-15 VITALS — BP 104/64 | Wt 193.0 lb

## 2017-07-15 DIAGNOSIS — Z3480 Encounter for supervision of other normal pregnancy, unspecified trimester: Secondary | ICD-10-CM

## 2017-07-15 DIAGNOSIS — Z3A37 37 weeks gestation of pregnancy: Secondary | ICD-10-CM

## 2017-07-15 DIAGNOSIS — O9989 Other specified diseases and conditions complicating pregnancy, childbirth and the puerperium: Secondary | ICD-10-CM

## 2017-07-15 DIAGNOSIS — R51 Headache: Secondary | ICD-10-CM

## 2017-07-15 NOTE — Progress Notes (Incomplete)
  Routine Prenatal Care Visit  Subjective  Amanda Allen is a 27 y.o. G2P1001 at 3774w0d being seen today for ongoing prenatal care.  She is currently monitored for the following issues for this {Blank single:19197::"high-risk","low-risk"} pregnancy and has Abnormal uterine bleeding; Dysmenorrhea; Pelvic peritoneal adhesions, female; Supervision of other normal pregnancy, antepartum; Headache in pregnancy, antepartum; and Labor and delivery, indication for care on their problem list.  ----------------------------------------------------------------------------------- Patient reports {sx:14538}.   Contractions: Irregular. Vag. Bleeding: None.  Movement: Present. Denies leaking of fluid.  ----------------------------------------------------------------------------------- The following portions of the patient's history were reviewed and updated as appropriate: allergies, current medications, past family history, past medical history, past social history, past surgical history and problem list. Problem list updated.   Objective  Blood pressure 104/64, weight 193 lb (87.5 kg). Pregravid weight 139 lb (63 kg) Total Weight Gain 54 lb (24.5 kg) Urinalysis: Urine Protein: Trace Urine Glucose: Negative  Fetal Status: Fetal Heart Rate (bpm): 135 Fundal Height: 37 cm Movement: Present  Presentation: Vertex  General:  Alert, oriented and cooperative. Patient is in no acute distress.  Skin: Skin is warm and dry. No rash noted.   Cardiovascular: Normal heart rate noted  Respiratory: Normal respiratory effort, no problems with respiration noted  Abdomen: Soft, gravid, appropriate for gestational age. Pain/Pressure: Absent     Pelvic:  {Blank single:19197::"Cervical exam performed","Cervical exam deferred"} Dilation: 1 Effacement (%): 40 Station: -3  Extremities: Normal range of motion.     Mental Status: Normal mood and affect. Normal behavior. Normal judgment and thought content.   Assessment   27 y.o.  G2P1001 at 3374w0d by  08/05/2017, by Ultrasound presenting for {Blank single:19197::"routine","work-in"} prenatal visit  Plan   SECOND Problems (from 12/12/16 to present)    Problem Noted Resolved   Supervision of other normal pregnancy, antepartum 12/12/2016 by Oswaldo ConroySchmid, Jacelyn Y, CNM No   Overview Addendum 06/05/2017  4:58 PM by Natale MilchSchuman, Christanna R, MD    [facility]inic [facility] Prenatal Labs  Dating 7 week US Blood type: B/Negative/-- (11/21 1532)   Genetic Informaseq XX Antibody:Negative (11/21 1532)  Anatomic US Normal Female Rubella: 1.11 (11/21 1532) Varicella:    GTT 116 RPR: Non Reactive (11/21 1532)   Rhogam N/A HBsAg: Negative (11/21 1532)   TDaP vaccine   06/05/17                     Flu Shot: HIV: Non Reactive (11/21 1532)   Baby Food   Breast                             GBS:   Contraception  Undecided Pap: 01/05/2015 NIL  CBB   Given information   CS/VBAC    Support Person Jared                 {Blank single:19197::"Term","Preterm"} labor symptoms and general obstetric precautions including but not limited to vaginal bleeding, contractions, leaking of fluid and fetal movement were reviewed in detail with the patient. Please refer to After Visit Summary for other counseling recommendations.   Return in about 1 week (around 07/22/2017) for Routine Prenatal Appointment.  Thomasene MohairStephen Clarine Elrod, MD, Merlinda FrederickFACOG [facility], [facility] 07/15/2017 11:50 AM

## 2017-07-15 NOTE — Progress Notes (Signed)
Routine Prenatal Care Visit  Subjective  Amanda Allen is a 27 y.o. G2P1001 at 8546w0d being seen today for ongoing prenatal care.  She is currently monitored for the following issues for this low-risk pregnancy and has Abnormal uterine bleeding; Dysmenorrhea; Pelvic peritoneal adhesions, female; Supervision of other normal pregnancy, antepartum; Headache in pregnancy, antepartum; and Labor and delivery, indication for care on their problem list.  ----------------------------------------------------------------------------------- Patient reports tingling on palmar surface of bilateral hands (L>R), no loss of sensation or strength. left arm uncomfortable all the way to shoulder, but still 100% functional.   Contractions: Irregular. Vag. Bleeding: None.  Movement: Present. Denies leaking of fluid.  ----------------------------------------------------------------------------------- The following portions of the patient's history were reviewed and updated as appropriate: allergies, current medications, past family history, past medical history, past social history, past surgical history and problem list. Problem list updated.   Objective  Blood pressure 104/64, weight 193 lb (87.5 kg). Pregravid weight 139 lb (63 kg) Total Weight Gain 54 lb (24.5 kg) Urinalysis: Urine Protein: Trace Urine Glucose: Negative  Fetal Status: Fetal Heart Rate (bpm): 135 Fundal Height: 37 cm Movement: Present  Presentation: Vertex  General:  Alert, oriented and cooperative. Patient is in no acute distress.  Skin: Skin is warm and dry. No rash noted.   Cardiovascular: Normal heart rate noted  Respiratory: Normal respiratory effort, no problems with respiration noted  Abdomen: Soft, gravid, appropriate for gestational age. Pain/Pressure: Absent     Pelvic:  Cervical exam performed Dilation: 1 Effacement (%): 40 Station: -3  Extremities: Normal range of motion.     Mental Status: Normal mood and affect. Normal  behavior. Normal judgment and thought content.   Assessment   27 y.o. G2P1001 at 5746w0d by  08/05/2017, by Ultrasound presenting for routine prenatal visit  Plan   SECOND Problems (from 12/12/16 to present)    Problem Noted Resolved   Supervision of other normal pregnancy, antepartum 12/12/2016 by Oswaldo ConroySchmid, Jacelyn Y, CNM No   Overview Addendum 06/05/2017  4:58 PM by Natale MilchSchuman, Christanna R, MD    [facility]inic [facility] Prenatal Labs  Dating 7 week US Blood type: B/Negative/-- (11/21 1532)   Genetic Informaseq XX Antibody:Negative (11/21 1532)  Anatomic US Normal Female Rubella: 1.11 (11/21 1532) Varicella:    GTT 116 RPR: Non Reactive (11/21 1532)   Rhogam N/A HBsAg: Negative (11/21 1532)   TDaP vaccine   06/05/17                     Flu Shot: HIV: Non Reactive (11/21 1532)   Baby Food   Breast                             GBS:   Contraception  Undecided Pap: 01/05/2015 NIL  CBB   Given information   CS/VBAC    Support Person Jilda PandaJared                 Term labor symptoms and general obstetric precautions including but not limited to vaginal bleeding, contractions, leaking of fluid and fetal movement were reviewed in detail with the patient. Please refer to After Visit Summary for other counseling recommendations.   -GBS/Aptima today. - discussed carpal tunnel syndrome.  Discussed conservative measures to treat. Will continue to monitor.    Return in about 1 week (around 07/22/2017) for Routine Prenatal Appointment.  Thomasene MohairStephen Mrytle Bento, MD, Merlinda FrederickFACOG [facility], [facility] 07/15/2017 11:50 AM

## 2017-07-15 NOTE — Progress Notes (Incomplete)
  Routine Prenatal Care Visit  Subjective  Amanda Allen is a 27 y.o. G2P1001 at [redacted]w[redacted]d being seen today for ongoing prenatal care.  She is currently monitored for the following issues for this {Blank single:19197::"high-risk","low-risk"} pregnancy and has Abnormal uterine bleeding; Dysmenorrhea; Pelvic peritoneal adhesions, female; Supervision of other normal pregnancy, antepartum; Headache in pregnancy, antepartum; and Labor and delivery, indication for care on their problem list.  ----------------------------------------------------------------------------------- Patient reports {sx:14538}.   Contractions: Irregular. Vag. Bleeding: None.  Movement: Present. Denies leaking of fluid.  ----------------------------------------------------------------------------------- The following portions of the patient's history were reviewed and updated as appropriate: allergies, current medications, past family history, past medical history, past social history, past surgical history and problem list. Problem list updated.   Objective  Blood pressure 104/64, weight 193 lb (87.5 kg). Pregravid weight 139 lb (63 kg) Total Weight Gain 54 lb (24.5 kg) Urinalysis: Urine Protein: Trace Urine Glucose: Negative  Fetal Status: Fetal Heart Rate (bpm): 135 Fundal Height: 37 cm Movement: Present  Presentation: Vertex  General:  Alert, oriented and cooperative. Patient is in no acute distress.  Skin: Skin is warm and dry. No rash noted.   Cardiovascular: Normal heart rate noted  Respiratory: Normal respiratory effort, no problems with respiration noted  Abdomen: Soft, gravid, appropriate for gestational age. Pain/Pressure: Absent     Pelvic:  {Blank single:19197::"Cervical exam performed","Cervical exam deferred"} Dilation: 1 Effacement (%): 40 Station: -3  Extremities: Normal range of motion.     Mental Status: Normal mood and affect. Normal behavior. Normal judgment and thought content.   Assessment   27 y.o.  G2P1001 at [redacted]w[redacted]d by  08/05/2017, by Ultrasound presenting for {Blank single:19197::"routine","work-in"} prenatal visit  Plan   SECOND Problems (from 12/12/16 to present)    Problem Noted Resolved   Supervision of other normal pregnancy, antepartum 12/12/2016 by Schmid, Jacelyn Y, CNM No   Overview Addendum 06/05/2017  4:58 PM by Schuman, Christanna R, MD    [facility]inic [facility] Prenatal Labs  Dating 7 week US Blood type: B/Negative/-- (11/21 1532)   Genetic Informaseq XX Antibody:Negative (11/21 1532)  Anatomic US Normal Female Rubella: 1.11 (11/21 1532) Varicella:    GTT 116 RPR: Non Reactive (11/21 1532)   Rhogam N/A HBsAg: Negative (11/21 1532)   TDaP vaccine   06/05/17                     Flu Shot: HIV: Non Reactive (11/21 1532)   Baby Food   Breast                             GBS:   Contraception  Undecided Pap: 01/05/2015 NIL  CBB   Given information   CS/VBAC    Support Person Jared                 {Blank single:19197::"Term","Preterm"} labor symptoms and general obstetric precautions including but not limited to vaginal bleeding, contractions, leaking of fluid and fetal movement were reviewed in detail with the patient. Please refer to After Visit Summary for other counseling recommendations.   Return in about 1 week (around 07/22/2017) for Routine Prenatal Appointment.  Sumire Halbleib, MD, FACOG [facility] 07/15/2017 11:50 AM   

## 2017-07-17 LAB — GC/CHLAMYDIA PROBE AMP
Chlamydia trachomatis, NAA: NEGATIVE
NEISSERIA GONORRHOEAE BY PCR: NEGATIVE

## 2017-07-17 LAB — STREP GP B NAA: Strep Gp B NAA: POSITIVE — AB

## 2017-07-22 ENCOUNTER — Ambulatory Visit (INDEPENDENT_AMBULATORY_CARE_PROVIDER_SITE_OTHER): Payer: BC Managed Care – PPO | Admitting: Obstetrics and Gynecology

## 2017-07-22 ENCOUNTER — Encounter: Payer: Self-pay | Admitting: Obstetrics and Gynecology

## 2017-07-22 VITALS — BP 112/64 | Wt 195.0 lb

## 2017-07-22 DIAGNOSIS — O26899 Other specified pregnancy related conditions, unspecified trimester: Secondary | ICD-10-CM

## 2017-07-22 DIAGNOSIS — Z3480 Encounter for supervision of other normal pregnancy, unspecified trimester: Secondary | ICD-10-CM

## 2017-07-22 DIAGNOSIS — O360130 Maternal care for anti-D [Rh] antibodies, third trimester, not applicable or unspecified: Secondary | ICD-10-CM

## 2017-07-22 DIAGNOSIS — Z3A38 38 weeks gestation of pregnancy: Secondary | ICD-10-CM

## 2017-07-22 DIAGNOSIS — Z6791 Unspecified blood type, Rh negative: Secondary | ICD-10-CM | POA: Insufficient documentation

## 2017-07-22 NOTE — Progress Notes (Signed)
  Routine Prenatal Care Visit  Subjective  Amanda Allen is a 27 y.o. G2P1001 at 3140w0d being seen today for ongoing prenatal care.  She is currently monitored for the following issues for this low-risk pregnancy and has Abnormal uterine bleeding; Dysmenorrhea; Pelvic peritoneal adhesions, female; Supervision of other normal pregnancy, antepartum; Headache in pregnancy, antepartum; and Rh negative state in antepartum period on their problem list.  ----------------------------------------------------------------------------------- Patient reports no complaints.   Contractions: Irregular. Vag. Bleeding: None.  Movement: Present. Denies leaking of fluid.  ----------------------------------------------------------------------------------- The following portions of the patient's history were reviewed and updated as appropriate: allergies, current medications, past family history, past medical history, past social history, past surgical history and problem list. Problem list updated.  Objective  Blood pressure 112/64, weight 195 lb (88.5 kg). Pregravid weight 139 lb (63 kg) Total Weight Gain 56 lb (25.4 kg) Urinalysis:      Fetal Status: Fetal Heart Rate (bpm): 140 Fundal Height: 39 cm Movement: Present  Presentation: Vertex  General:  Alert, oriented and cooperative. Patient is in no acute distress.  Skin: Skin is warm and dry. No rash noted.   Cardiovascular: Normal heart rate noted  Respiratory: Normal respiratory effort, no problems with respiration noted  Abdomen: Soft, gravid, appropriate for gestational age. Pain/Pressure: Present     Pelvic:  Cervical exam performed Dilation: 1.5 Effacement (%): 40 Station: -3  Extremities: Normal range of motion.  Edema: None  Mental Status: Normal mood and affect. Normal behavior. Normal judgment and thought content.   Assessment   27 y.o. G2P1001 at 3140w0d by  08/05/2017, by Ultrasound presenting for routine prenatal visit  Plan   SECOND Problems  (from 12/12/16 to present)    Problem Noted Resolved   Rh negative state in antepartum period 07/22/2017 by Conard NovakJackson, Karissa Meenan D, MD No   Supervision of other normal pregnancy, antepartum 12/12/2016 by Oswaldo ConroySchmid, Jacelyn Y, CNM No   Overview Addendum 06/05/2017  4:58 PM by Natale MilchSchuman, Christanna R, MD    [facility] Prenatal Labs  Dating 7 week US Blood type: B/Negative/-- (11/21 1532)   Genetic Informaseq XX Antibody:Negative (11/21 1532)  Anatomic US Normal Female Rubella: 1.11 (11/21 1532) Varicella:    GTT 116 RPR: Non Reactive (11/21 1532)   Rhogam N/A HBsAg: Negative (11/21 1532)   TDaP vaccine   06/05/17                     Flu Shot: HIV: Non Reactive (11/21 1532)   Baby Food   Breast                             GBS:   Contraception  Undecided Pap: 01/05/2015 NIL  CBB   Given information   CS/VBAC    Support Person Jilda PandaJared              Term labor symptoms and general obstetric precautions including but not limited to vaginal bleeding, contractions, leaking of fluid and fetal movement were reviewed in detail with the patient. Please refer to After Visit Summary for other counseling recommendations.   Return in about 1 week (around 07/29/2017) for Routine Prenatal Appointment.  Thomasene MohairStephen Yao Hyppolite, MD, Merlinda FrederickFACOG [facility],  [facility] 07/22/2017 3:49 PM

## 2017-07-22 NOTE — Progress Notes (Incomplete)
  Routine Prenatal Care Visit  Subjective  Amanda Allen is a 27 y.o. G2P1001 at 5470w0d being seen today for ongoing prenatal care.  She is currently monitored for the following issues for this {Blank single:19197::"high-risk","low-risk"} pregnancy and has Abnormal uterine bleeding; Dysmenorrhea; Pelvic peritoneal adhesions, female; Supervision of other normal pregnancy, antepartum; Headache in pregnancy, antepartum; and Rh negative state in antepartum period on their problem list.  ----------------------------------------------------------------------------------- Patient reports {sx:14538}.   Contractions: Irregular. Vag. Bleeding: None.  Movement: Present. Denies leaking of fluid.  ----------------------------------------------------------------------------------- The following portions of the patient's history were reviewed and updated as appropriate: allergies, current medications, past family history, past medical history, past social history, past surgical history and problem list. Problem list updated.   Objective  Blood pressure 112/64, weight 195 lb (88.5 kg). Pregravid weight 139 lb (63 kg) Total Weight Gain 56 lb (25.4 kg) Urinalysis:      Fetal Status: Fetal Heart Rate (bpm): 140 Fundal Height: 39 cm Movement: Present  Presentation: Vertex  General:  Alert, oriented and cooperative. Patient is in no acute distress.  Skin: Skin is warm and dry. No rash noted.   Cardiovascular: Normal heart rate noted  Respiratory: Normal respiratory effort, no problems with respiration noted  Abdomen: Soft, gravid, appropriate for gestational age. Pain/Pressure: Present     Pelvic:  {Blank single:19197::"Cervical exam performed","Cervical exam deferred"} Dilation: 1.5 Effacement (%): 40 Station: -3  Extremities: Normal range of motion.  Edema: None  Mental Status: Normal mood and affect. Normal behavior. Normal judgment and thought content.   Assessment   27 y.o. G2P1001 at 970w0d by   08/05/2017, by Ultrasound presenting for {Blank single:19197::"routine","work-in"} prenatal visit  Plan   SECOND Problems (from 12/12/16 to present)    Problem Noted Resolved   Rh negative state in antepartum period 07/22/2017 by Conard NovakJackson, Rosabelle Jupin D, MD No   Supervision of other normal pregnancy, antepartum 12/12/2016 by Oswaldo ConroySchmid, Jacelyn Y, CNM No   Overview Addendum 06/05/2017  4:58 PM by Natale MilchSchuman, Christanna R, MD    [facility] Prenatal Labs  Dating 7 week US Blood type: B/Negative/-- (11/21 1532)   Genetic Informaseq XX Antibody:Negative (11/21 1532)  Anatomic US Normal Female Rubella: 1.11 (11/21 1532) Varicella:    GTT 116 RPR: Non Reactive (11/21 1532)   Rhogam N/A HBsAg: Negative (11/21 1532)   TDaP vaccine   06/05/17                     Flu Shot: HIV: Non Reactive (11/21 1532)   Baby Food   Breast                             GBS:   Contraception  Undecided Pap: 01/05/2015 NIL  CBB   Given information   CS/VBAC    Support Person Jared                 {Blank single:19197::"Term","Preterm"} labor symptoms and general obstetric precautions including but not limited to vaginal bleeding, contractions, leaking of fluid and fetal movement were reviewed in detail with the patient. Please refer to After Visit Summary for other counseling recommendations.   Return in about 1 week (around 07/29/2017) for Routine Prenatal Appointment.  Thomasene MohairStephen Taejon Irani, MD, Merlinda FrederickFACOG [facility], [facility] 07/22/2017 3:49 PM

## 2017-07-26 ENCOUNTER — Telehealth: Payer: Self-pay

## 2017-07-26 NOTE — Telephone Encounter (Signed)
Pt calling SDJ to find out if he knows anything about her being induced on 7/8? She states she is on a waiting list. I advised her if sdj was aware of her being induced he would of called her. I will send this message to SDJ and if he knows anything he will call and if not just keep her appt monday

## 2017-07-29 ENCOUNTER — Ambulatory Visit (INDEPENDENT_AMBULATORY_CARE_PROVIDER_SITE_OTHER): Payer: BC Managed Care – PPO | Admitting: Obstetrics and Gynecology

## 2017-07-29 VITALS — BP 128/82 | Wt 195.0 lb

## 2017-07-29 DIAGNOSIS — Z3480 Encounter for supervision of other normal pregnancy, unspecified trimester: Secondary | ICD-10-CM

## 2017-07-29 DIAGNOSIS — Z3A39 39 weeks gestation of pregnancy: Secondary | ICD-10-CM

## 2017-07-29 NOTE — Progress Notes (Signed)
ROB

## 2017-07-29 NOTE — Progress Notes (Signed)
    Routine Prenatal Care Visit  Subjective  Amanda Allen is a 27 y.o. G2P1001 at 4289w0d being seen today for ongoing prenatal care.  She is currently monitored for the following issues for this low-risk pregnancy and has Pelvic peritoneal adhesions, female; Supervision of other normal pregnancy, antepartum; and Rh negative state in antepartum period on their problem list.  ----------------------------------------------------------------------------------- Patient reports no complaints.   Contractions: Irregular. Vag. Bleeding: None.  Movement: Present. Denies leaking of fluid.  ----------------------------------------------------------------------------------- The following portions of the patient's history were reviewed and updated as appropriate: allergies, current medications, past family history, past medical history, past social history, past surgical history and problem list. Problem list updated.   Objective  Blood pressure 128/82, weight 195 lb (88.5 kg). Pregravid weight 139 lb (63 kg) Total Weight Gain 56 lb (25.4 kg) Urinalysis:      Fetal Status: Fetal Heart Rate (bpm): 140 Fundal Height: 39 cm Movement: Present  Presentation: Vertex  General:  Alert, oriented and cooperative. Patient is in no acute distress.  Skin: Skin is warm and dry. No rash noted.   Cardiovascular: Normal heart rate noted  Respiratory: Normal respiratory effort, no problems with respiration noted  Abdomen: Soft, gravid, appropriate for gestational age. Pain/Pressure: Present     Pelvic:  Cervical exam performed Dilation: 2.5 Effacement (%): 70 Station: -3  Extremities: Normal range of motion.     ental Status: Normal mood and affect. Normal behavior. Normal judgment and thought content.     Assessment   27 y.o. G2P1001 at 7689w0d by  08/05/2017, by Ultrasound presenting for routine prenatal visit  Plan   SECOND Problems (from 12/12/16 to present)    Problem Noted Resolved   Rh negative state in  antepartum period 07/22/2017 by Conard NovakJackson, Stephen D, MD No   Supervision of other normal pregnancy, antepartum 12/12/2016 by Oswaldo ConroySchmid, Jacelyn Y, CNM No   Overview Addendum 07/29/2017 11:53 AM by Vena AustriaStaebler, Demerius Podolak, MD    [facility] Prenatal Labs  Dating 7 week US Blood type: B/Negative/-- (11/21 1532)   Genetic Informaseq XX Antibody:Negative (11/21 1532)  Anatomic US Normal Female Rubella: 1.11 (11/21 1532) Varicella:    GTT 116 RPR: Non Reactive (11/21 1532)   Rhogam N/A HBsAg: Negative (11/21 1532)   TDaP vaccine   06/05/17                     Flu Shot: HIV: Non Reactive (11/21 1532)   Baby Food   Breast                             GBS: positive  Contraception  Undecided Pap: 01/05/2015 NIL  CBB   Given information   CS/VBAC    Support Person Jilda PandaJared                 Gestational age appropriate obstetric precautions including but not limited to vaginal bleeding, contractions, leaking of fluid and fetal movement were reviewed in detail with the patient.   - on wait list for IOL today, [place]17 [place] - membranes stripped, repeat Wednesday if not in labor and re-assess induction dates if not delivered  Return in about 2 days (around 07/31/2017) for ROB.  Vena AustriaAndreas Mackinsey Pelland, MD, Evern CoreFACOG [facility], [facility] 07/29/2017, 12:07 PM

## 2017-07-31 ENCOUNTER — Other Ambulatory Visit: Payer: Self-pay

## 2017-07-31 ENCOUNTER — Ambulatory Visit (INDEPENDENT_AMBULATORY_CARE_PROVIDER_SITE_OTHER): Payer: BC Managed Care – PPO | Admitting: Maternal Newborn

## 2017-07-31 ENCOUNTER — Observation Stay
Admission: EM | Admit: 2017-07-31 | Discharge: 2017-07-31 | Disposition: A | Discharge status: Home / Self Care | Payer: BC Managed Care – PPO | Attending: Obstetrics and Gynecology | Admitting: Obstetrics and Gynecology

## 2017-07-31 ENCOUNTER — Encounter: Payer: Self-pay | Admitting: Maternal Newborn

## 2017-07-31 VITALS — BP 120/70 | Wt 192.0 lb

## 2017-07-31 DIAGNOSIS — O26899 Other specified pregnancy related conditions, unspecified trimester: Secondary | ICD-10-CM

## 2017-07-31 DIAGNOSIS — O471 False labor at or after 37 completed weeks of gestation: Secondary | ICD-10-CM

## 2017-07-31 DIAGNOSIS — Z6791 Unspecified blood type, Rh negative: Secondary | ICD-10-CM

## 2017-07-31 DIAGNOSIS — Z3A39 39 weeks gestation of pregnancy: Secondary | ICD-10-CM | POA: Diagnosis not present

## 2017-07-31 DIAGNOSIS — O4703 False labor before 37 completed weeks of gestation, third trimester: Secondary | ICD-10-CM | POA: Diagnosis present

## 2017-07-31 DIAGNOSIS — Z3480 Encounter for supervision of other normal pregnancy, unspecified trimester: Secondary | ICD-10-CM

## 2017-07-31 MED ORDER — ACETAMINOPHEN 325 MG PO TABS: 650.0000 mg | ORAL_TABLET | ORAL | Status: DC | PRN

## 2017-07-31 NOTE — Patient Instructions (Signed)
Vaginal Delivery Vaginal delivery means that you will give birth by pushing your baby out of your birth canal (vagina). A team of health care providers will help you before, during, and after vaginal delivery. Birth experiences are unique for every woman and every pregnancy, and birth experiences vary depending on where you choose to give birth. What should I do to prepare for my baby's birth? Before your baby is born, it is important to talk with your health care provider about:  Your labor and delivery preferences. These may include: ? Medicines that you may be given. ? How you will manage your pain. This might include non-medical pain relief techniques or injectable pain relief such as epidural analgesia. ? How you and your baby will be monitored during labor and delivery. ? Who may be in the labor and delivery room with you. ? Your feelings about surgical delivery of your baby (cesarean delivery, or C-section) if this becomes necessary. ? Your feelings about receiving donated blood through an IV tube (blood transfusion) if this becomes necessary.  Whether you are able: ? To take pictures or videos of the birth. ? To eat during labor and delivery. ? To move around, walk, or change positions during labor and delivery.  What to expect after your baby is born, such as: ? Whether delayed umbilical cord clamping and cutting is offered. ? Who will care for your baby right after birth. ? Medicines or tests that may be recommended for your baby. ? Whether breastfeeding is supported in your hospital or birth center. ? How long you will be in the hospital or birth center.  How any medical conditions you have may affect your baby or your labor and delivery experience.  To prepare for your baby's birth, you should also:  Attend all of your health care visits before delivery (prenatal visits) as recommended by your health care provider. This is important.  Prepare your home for your baby's  arrival. Make sure that you have: ? Diapers. ? Baby clothing. ? Feeding equipment. ? Safe sleeping arrangements for you and your baby.  Install a car seat in your vehicle. Have your car seat checked by a certified car seat installer to make sure that it is installed safely.  Think about who will help you with your new baby at home for at least the first several weeks after delivery.  What can I expect when I arrive at the birth center or hospital? Once you are in labor and have been admitted into the hospital or birth center, your health care provider may:  Review your pregnancy history and any concerns you have.  Insert an IV tube into one of your veins. This is used to give you fluids and medicines.  Check your blood pressure, pulse, temperature, and heart rate (vital signs).  Check whether your bag of water (amniotic sac) has broken (ruptured).  Talk with you about your birth plan and discuss pain control options.  Monitoring Your health care provider may monitor your contractions (uterine monitoring) and your baby's heart rate (fetal monitoring). You may need to be monitored:  Often, but not continuously (intermittently).  All the time or for long periods at a time (continuously). Continuous monitoring may be needed if: ? You are taking certain medicines, such as medicine to relieve pain or make your contractions stronger. ? You have pregnancy or labor complications.  Monitoring may be done by:  Placing a special stethoscope or a handheld monitoring device on your abdomen to   check your baby's heartbeat, and feeling your abdomen for contractions. This method of monitoring does not continuously record your baby's heartbeat or your contractions.  Placing monitors on your abdomen (external monitors) to record your baby's heartbeat and the frequency and length of contractions. You may not have to wear external monitors all the time.  Placing monitors inside of your uterus  (internal monitors) to record your baby's heartbeat and the frequency, length, and strength of your contractions. ? Your health care provider may use internal monitors if he or she needs more information about the strength of your contractions or your baby's heart rate. ? Internal monitors are put in place by passing a thin, flexible wire through your vagina and into your uterus. Depending on the type of monitor, it may remain in your uterus or on your baby's head until birth. ? Your health care provider will discuss the benefits and risks of internal monitoring with you and will ask for your permission before inserting the monitors.  Telemetry. This is a type of continuous monitoring that can be done with external or internal monitors. Instead of having to stay in bed, you are able to move around during telemetry. Ask your health care provider if telemetry is an option for you.  Physical exam Your health care provider may perform a physical exam. This may include:  Checking whether your baby is positioned: ? With the head toward your vagina (head-down). This is most common. ? With the head toward the top of your uterus (head-up or breech). If your baby is in a breech position, your health care provider may try to turn your baby to a head-down position so you can deliver vaginally. If it does not seem that your baby can be born vaginally, your provider may recommend surgery to deliver your baby. In rare cases, you may be able to deliver vaginally if your baby is head-up (breech delivery). ? Lying sideways (transverse). Babies that are lying sideways cannot be delivered vaginally.  Checking your cervix to determine: ? Whether it is thinning out (effacing). ? Whether it is opening up (dilating). ? How low your baby has moved into your birth canal.  What are the three stages of labor and delivery?  Normal labor and delivery is divided into the following three stages: Stage 1  Stage 1 is the  longest stage of labor, and it can last for hours or days. Stage 1 includes: ? Early labor. This is when contractions may be irregular, or regular and mild. Generally, early labor contractions are more than 10 minutes apart. ? Active labor. This is when contractions get longer, more regular, more frequent, and more intense. ? The transition phase. This is when contractions happen very close together, are very intense, and may last longer than during any other part of labor.  Contractions generally feel mild, infrequent, and irregular at first. They get stronger, more frequent (about every 2-3 minutes), and more regular as you progress from early labor through active labor and transition.  Many women progress through stage 1 naturally, but you may need help to continue making progress. If this happens, your health care provider may talk with you about: ? Rupturing your amniotic sac if it has not ruptured yet. ? Giving you medicine to help make your contractions stronger and more frequent.  Stage 1 ends when your cervix is completely dilated to 4 inches (10 cm) and completely effaced. This happens at the end of the transition phase. Stage 2  Once   your cervix is completely effaced and dilated to 4 inches (10 cm), you may start to feel an urge to push. It is common for the body to naturally take a rest before feeling the urge to push, especially if you received an epidural or certain other pain medicines. This rest period may last for up to 1-2 hours, depending on your unique labor experience.  During stage 2, contractions are generally less painful, because pushing helps relieve contraction pain. Instead of contraction pain, you may feel stretching and burning pain, especially when the widest part of your baby's head passes through the vaginal opening (crowning).  Your health care provider will closely monitor your pushing progress and your baby's progress through the vagina during stage 2.  Your  health care provider may massage the area of skin between your vaginal opening and anus (perineum) or apply warm compresses to your perineum. This helps it stretch as the baby's head starts to crown, which can help prevent perineal tearing. ? In some cases, an incision may be made in your perineum (episiotomy) to allow the baby to pass through the vaginal opening. An episiotomy helps to make the opening of the vagina larger to allow more room for the baby to fit through.  It is very important to breathe and focus so your health care provider can control the delivery of your baby's head. Your health care provider may have you decrease the intensity of your pushing, to help prevent perineal tearing.  After delivery of your baby's head, the shoulders and the rest of the body generally deliver very quickly and without difficulty.  Once your baby is delivered, the umbilical cord may be cut right away, or this may be delayed for 1-2 minutes, depending on your baby's health. This may vary among health care providers, hospitals, and birth centers.  If you and your baby are healthy enough, your baby may be placed on your chest or abdomen to help maintain the baby's temperature and to help you bond with each other. Some mothers and babies start breastfeeding at this time. Your health care team will dry your baby and help keep your baby warm during this time.  Your baby may need immediate care if he or she: ? Showed signs of distress during labor. ? Has a medical condition. ? Was born too early (prematurely). ? Had a bowel movement before birth (meconium). ? Shows signs of difficulty transitioning from being inside the uterus to being outside of the uterus. If you are planning to breastfeed, your health care team will help you begin a feeding. Stage 3  The third stage of labor starts immediately after the birth of your baby and ends after you deliver the placenta. The placenta is an organ that develops  during pregnancy to provide oxygen and nutrients to your baby in the womb.  Delivering the placenta may require some pushing, and you may have mild contractions. Breastfeeding can stimulate contractions to help you deliver the placenta.  After the placenta is delivered, your uterus should tighten (contract) and become firm. This helps to stop bleeding in your uterus. To help your uterus contract and to control bleeding, your health care provider may: ? Give you medicine by injection, through an IV tube, by mouth, or through your rectum (rectally). ? Massage your abdomen or perform a vaginal exam to remove any blood clots that are left in your uterus. ? Empty your bladder by placing a thin, flexible tube (catheter) into your bladder. ? Encourage   you to breastfeed your baby. After labor is over, you and your baby will be monitored closely to ensure that you are both healthy until you are ready to go home. Your health care team will teach you how to care for yourself and your baby. This information is not intended to replace advice given to you by your health care provider. Make sure you discuss any questions you have with your health care provider. Document Released: 10/18/2007 Document Revised: 07/29/2015 Document Reviewed: 01/23/2015 Elsevier Interactive Patient Education  2018 [employer]  

## 2017-07-31 NOTE — OB Triage Note (Signed)
Discharge instructions provided and reviewed.  Follow up care discussed.  Pt verbalized understanding. 

## 2017-07-31 NOTE — Progress Notes (Signed)
Routine Prenatal Care Visit  Subjective  Amanda Allen is a 27 y.o. G2P1001 at [redacted]w[redacted]d being seen today for ongoing prenatal care.  She is currently monitored for the following issues for this low-risk pregnancy and has Pelvic peritoneal adhesions, female; Supervision of other normal pregnancy, antepartum; Rh negative state in antepartum period; and Labor and delivery indication for care or intervention on their problem list.  ---------------------------------------------------------------------------------- Patient reports contractions. They were about every 3-4 minutes apart last night. Went to triage and was discharged because her cervix did not change. She is having some contractions today but they are not as frequent.   Contractions: Irregular. Vag. Bleeding: None.  Movement: Present. No leaking of fluid.  ---------------------------------------------------------------------------------- The following portions of the patient's history were reviewed and updated as appropriate: allergies, current medications, past family history, past medical history, past social history, past surgical history and problem list. Problem list updated.   Objective  Blood pressure 120/70, weight 192 lb (87.1 kg). Pregravid weight 139 lb (63 kg) Total Weight Gain 53 lb (24 kg) Body mass index is 32.96 kg/m. Urinalysis: Urine Protein: Negative Urine Glucose: Negative  Fetal Status: Fetal Heart Rate (bpm): 130 Fundal Height: 39 cm Movement: Present  Presentation: Vertex  General:  Alert, oriented and cooperative. Patient is in no acute distress.  Skin: Skin is warm and dry. No rash noted.   Cardiovascular: Normal heart rate noted  Respiratory: Normal respiratory effort, no problems with respiration noted  Abdomen: Soft, gravid, appropriate for gestational age. Pain/Pressure: Present     Pelvic:  Cervical exam performed Dilation: 2.5 Effacement (%): 60 Station: -3  Extremities: Normal range of motion.   Edema: None  Mental Status: Normal mood and affect. Normal behavior. Normal judgment and thought content.     Assessment   27 y.o. G2P1001 at [redacted]w[redacted]d, EDD 08/05/2017 by Ultrasound presenting for routine prenatal visit.  Plan   SECOND Problems (from 12/12/16 to present)    Problem Noted Resolved   Rh negative state in antepartum period 07/22/2017 by Conard Novak, MD No   Supervision of other normal pregnancy, antepartum 12/12/2016 by Oswaldo Conroy, CNM No   Overview Addendum 07/29/2017 11:54 AM by Vena Austria, MD    Clinic [facility] Prenatal Labs  Dating 7 week [place] Blood type: B/Negative/-- (11/21 1532)   Genetic Informaseq XX Antibody:Negative (11/21 1532)  Anatomic [place] Normal Female Rubella: 1.11 (11/21 1532) Varicella:    GTT 116 RPR: Non Reactive (11/21 1532)   Rhogam N/A HBsAg: Negative (11/21 1532)   TDaP vaccine   06/05/17                     Flu Shot: HIV: Non Reactive (11/21 1532)   Baby Food   Breast                             GBS: positive  Contraception  Undecided Pap: 01/05/2015 NIL  CBB   Given information Pelvis tested to 7lbs 14oz  CS/VBAC    Support Person [employer] [employer] Labor and Delivery to see if an induction time had become available. Currently no open times until 7/17. She does not wish to schedule an induction for that date, but would still like to have any open date that may occur before the end of this week when her family is going out of town.  Declined scheduling a postdates induction today.  Term labor symptoms and general obstetric precautions including but not limited to vaginal bleeding, contractions, leaking of fluid and fetal movement were reviewed.  Please refer to After Visit Summary for other counseling recommendations.   Return in about 1 week (around 08/07/2017) for ROB.  Amanda Allen, CNM 07/31/2017  11:36 AM

## 2017-07-31 NOTE — Progress Notes (Signed)
C/o wants cx ck c membranes stripped again today; was in L&D last night for ctxs but there was no dilation.  Is there anything that will make her dilate.  Her whole family is going out of the country at the end of the week and will be gone until the 27th. Is on waiting list for IOL.

## 2017-07-31 NOTE — Discharge Summary (Signed)
See final progress note. 

## 2017-07-31 NOTE — OB Triage Note (Signed)
Pt arrival to triage with c/o contractions starting around 1930, every 3-414min.  Pt denies vaginal bleeding, LOF, and is feeling baby move normally.  EFM and toco applied and tracing.

## 2017-07-31 NOTE — Final Progress Note (Signed)
Physician Final Progress Note  Patient ID: Amanda Allen MRN: [id number] DOB/AGE: 02-26-1990 26 y.o.  Admit date: 07/31/2017 Admitting provider: Vena Austria, MD Discharge date: 07/31/2017   Admission Diagnoses:  Labor check  Discharge Diagnoses:  Active Problems:   Labor and delivery indication for care or intervention  Braxton Hicks contractions  27 year old G2P1001 at [redacted]w[redacted]d presenting with contractions.  Cervix unchanged from clinic check 2 days ago, and no further cervical change over initial hour of monitoring. +FM, no LOF, no VB.    Consults: None  Significant Findings/ Diagnostic Studies: none  Procedures:  Baseline: 120 Variability: moderate Accelerations: present Decelerations: absent Tocometry: q1-12min The patient was monitored for 30 minutes, fetal heart rate tracing was deemed reactive, category I tracing,  CPT 59025\  Discharge Condition: good  Disposition: Discharge disposition: 01-Home or Self Care       Diet: Regular diet  Discharge Activity: Activity as tolerated  Discharge Instructions    Discharge activity:  No Restrictions   Complete by:  As directed    Discharge diet:  No restrictions   Complete by:  As directed    Fetal Kick Count:  Lie on our left side for one hour after a meal, and count the number of times your baby kicks.  If it is less than 5 times, get up, move around and drink some juice.  Repeat the test 30 minutes later.  If it is still less than 5 kicks in an hour, notify your doctor.   Complete by:  As directed    LABOR:  When conractions begin, you should start to time them from the beginning of one contraction to the beginning  of the next.  When contractions are 5 - 10 minutes apart or less and have been regular for at least an hour, you should call your health care provider.   Complete by:  As directed    No sexual activity restrictions   Complete by:  As directed    Notify physician for bleeding from the vagina    Complete by:  As directed    Notify physician for blurring of vision or spots before the eyes   Complete by:  As directed    Notify physician for chills or fever   Complete by:  As directed    Notify physician for fainting spells, "black outs" or loss of consciousness   Complete by:  As directed    Notify physician for increase in vaginal discharge   Complete by:  As directed    Notify physician for leaking of fluid   Complete by:  As directed    Notify physician for pain or burning when urinating   Complete by:  As directed    Notify physician for pelvic pressure (sudden increase)   Complete by:  As directed    Notify physician for severe or continued nausea or vomiting   Complete by:  As directed    Notify physician for sudden gushing of fluid from the vagina (with or without continued leaking)   Complete by:  As directed    Notify physician for sudden, constant, or occasional abdominal pain   Complete by:  As directed    Notify physician if baby moving less than usual   Complete by:  As directed      Allergies as of 07/31/2017   No Known Allergies     Medication List    TAKE these medications   prenatal multivitamin Tabs tablet Take 1 tablet  by mouth daily at 12 noon.        Triaged remotely over phone with nursing  Signed: Vena Austriandreas Ark Agrusa 07/31/2017, 3:15 AM

## 2017-07-31 NOTE — Discharge Instructions (Signed)
Braxton Hicks Contractions °Contractions of the uterus can occur throughout pregnancy, but they are not always a sign that you are in labor. You may have practice contractions called Braxton Hicks contractions. These false labor contractions are sometimes confused with true labor. °What are Braxton Hicks contractions? °Braxton Hicks contractions are tightening movements that occur in the muscles of the uterus before labor. Unlike true labor contractions, these contractions do not result in opening (dilation) and thinning of the cervix. Toward the end of pregnancy (32-34 weeks), Braxton Hicks contractions can happen more often and may become stronger. These contractions are sometimes difficult to tell apart from true labor because they can be very uncomfortable. You should not feel embarrassed if you go to the hospital with false labor. °Sometimes, the only way to tell if you are in true labor is for your health care provider to look for changes in the cervix. The health care provider will do a physical exam and may monitor your contractions. If you are not in true labor, the exam should show that your cervix is not dilating and your water has not broken. °If there are other health problems associated with your pregnancy, it is completely safe for you to be sent home with false labor. You may continue to have Braxton Hicks contractions until you go into true labor. °How to tell the difference between true labor and false labor °True labor °· Contractions last 30-70 seconds. °· Contractions become very regular. °· Discomfort is usually felt in the top of the uterus, and it spreads to the lower abdomen and low back. °· Contractions do not go away with walking. °· Contractions usually become more intense and increase in frequency. °· The cervix dilates and gets thinner. °False labor °· Contractions are usually shorter and not as strong as true labor contractions. °· Contractions are usually irregular. °· Contractions  are often felt in the front of the lower abdomen and in the groin. °· Contractions may go away when you walk around or change positions while lying down. °· Contractions get weaker and are shorter-lasting as time goes on. °· The cervix usually does not dilate or become thin. °Follow these instructions at home: °· Take over-the-counter and prescription medicines only as told by your health care provider. °· Keep up with your usual exercises and follow other instructions from your health care provider. °· Eat and drink lightly if you think you are going into labor. °· If Braxton Hicks contractions are making you uncomfortable: °? Change your position from lying down or resting to walking, or change from walking to resting. °? Sit and rest in a tub of warm water. °? Drink enough fluid to keep your urine pale yellow. Dehydration may cause these contractions. °? Do slow and deep breathing several times an hour. °· Keep all follow-up prenatal visits as told by your health care provider. This is important. °Contact a health care provider if: °· You have a fever. °· You have continuous pain in your abdomen. °Get help right away if: °· Your contractions become stronger, more regular, and closer together. °· You have fluid leaking or gushing from your vagina. °· You pass blood-tinged mucus (bloody show). °· You have bleeding from your vagina. °· You have low back pain that you never had before. °· You feel your baby’s head pushing down and causing pelvic pressure. °· Your baby is not moving inside you as much as it used to. °Summary °· Contractions that occur before labor are called Braxton   Hicks contractions, false labor, or practice contractions. °· Braxton Hicks contractions are usually shorter, weaker, farther apart, and less regular than true labor contractions. True labor contractions usually become progressively stronger and regular and they become more frequent. °· Manage discomfort from Braxton Hicks contractions by  changing position, resting in a warm bath, drinking plenty of water, or practicing deep breathing. °This information is not intended to replace advice given to you by your health care provider. Make sure you discuss any questions you have with your health care provider. °Document Released: 05/24/2016 Document Revised: 05/24/2016 Document Reviewed: 05/24/2016 °Elsevier Interactive Patient Education © 2018 [employer] ° °

## 2017-08-05 ENCOUNTER — Inpatient Hospital Stay: Admit: 2017-08-05 | Payer: Self-pay

## 2017-08-06 ENCOUNTER — Ambulatory Visit (INDEPENDENT_AMBULATORY_CARE_PROVIDER_SITE_OTHER): Payer: BC Managed Care – PPO | Admitting: Obstetrics & Gynecology

## 2017-08-06 VITALS — BP 112/62 | Wt 194.0 lb

## 2017-08-06 DIAGNOSIS — Z3A40 40 weeks gestation of pregnancy: Secondary | ICD-10-CM

## 2017-08-06 DIAGNOSIS — O480 Post-term pregnancy: Secondary | ICD-10-CM

## 2017-08-06 DIAGNOSIS — Z3480 Encounter for supervision of other normal pregnancy, unspecified trimester: Secondary | ICD-10-CM

## 2017-08-06 NOTE — Patient Instructions (Signed)
Labor Induction Labor induction is when steps are taken to cause a pregnant woman to begin the labor process. Most women go into labor on their own between 37 weeks and 42 weeks of the pregnancy. When this does not happen or when there is a medical need, methods may be used to induce labor. Labor induction causes a pregnant woman's uterus to contract. It also causes the cervix to soften (ripen), open (dilate), and thin out (efface). Usually, labor is not induced before 39 weeks of the pregnancy unless there is a problem with the baby or mother. Before inducing labor, your health care provider will consider a number of factors, including the following:  The medical condition of you and the baby.  How many weeks along you are.  The status of the baby's lung maturity.  The condition of the cervix.  The position of the baby. What are the reasons for labor induction? Labor may be induced for the following reasons:  The health of the baby or mother is at risk.  The pregnancy is overdue by 1 week or more.  The water breaks but labor does not start on its own.  The mother has a health condition or serious illness, such as high blood pressure, infection, placental abruption, or diabetes.  The amniotic fluid amounts are low around the baby.  The baby is distressed. Convenience or wanting the baby to be born on a certain date is not a reason for inducing labor. What methods are used for labor induction? Several methods of labor induction may be used, such as:  Prostaglandin medicine. This medicine causes the cervix to dilate and ripen. The medicine will also start contractions. It can be taken by mouth or by inserting a suppository into the vagina.  Inserting a thin tube (catheter) with a balloon on the end into the vagina to dilate the cervix. Once inserted, the balloon is expanded with water, which causes the cervix to open.  Stripping the membranes. Your health care provider separates  amniotic sac tissue from the cervix, causing the cervix to be stretched and causing the release of a hormone called progesterone. This may cause the uterus to contract. It is often done during an office visit. You will be sent home to wait for the contractions to begin. You will then come in for an induction.  Breaking the water. Your health care provider makes a hole in the amniotic sac using a small instrument. Once the amniotic sac breaks, contractions should begin. This may still take hours to see an effect.  Medicine to trigger or strengthen contractions. This medicine is given through an IV access tube inserted into a vein in your arm. All of the methods of induction, besides stripping the membranes, will be done in the hospital. Induction is done in the hospital so that you and the baby can be carefully monitored. How long does it take for labor to be induced? Some inductions can take up to 2-3 days. Depending on the cervix, it usually takes less time. It takes longer when you are induced early in the pregnancy or if this is your first pregnancy. If a mother is still pregnant and the induction has been going on for 2-3 days, either the mother will be sent home or a cesarean delivery will be needed. What are the risks associated with labor induction? Some of the risks of induction include:  Changes in fetal heart rate, such as too high, too low, or erratic.  Fetal distress.    Chance of infection for the mother and baby.  Increased chance of having a cesarean delivery.  Breaking off (abruption) of the placenta from the uterus (rare).  Uterine rupture (very rare). When induction is needed for medical reasons, the benefits of induction may outweigh the risks. What are some reasons for not inducing labor? Labor induction should not be done if:  It is shown that your baby does not tolerate labor.  You have had previous surgeries on your uterus, such as a myomectomy or the removal of  fibroids.  Your placenta lies very low in the uterus and blocks the opening of the cervix (placenta previa).  Your baby is not in a head-down position.  The umbilical cord drops down into the birth canal in front of the baby. This could cut off the baby's blood and oxygen supply.  You have had a previous cesarean delivery.  There are unusual circumstances, such as the baby being extremely premature. This information is not intended to replace advice given to you by your health care provider. Make sure you discuss any questions you have with your health care provider. Document Released: 05/30/2006 Document Revised: 06/16/2015 Document Reviewed: 08/07/2012 Elsevier Interactive Patient Education  2017 Elsevier Inc.  

## 2017-08-06 NOTE — Progress Notes (Addendum)
History and Physical  Amanda Allen is a 27 y.o. G2P1001 [redacted]w[redacted]d  for Induction of Labor scheduled due to Postdates and Favorable cervix at term .   See labor record for pregnancy highlights.  No recent pain, bleeding, ruptured membranes, or other signs of progressing labor.  PMHx: She  has a past medical history of Dysmenorrhea, Headache, Irregular menses, Nipple discharge in female, Panic attack, and Pilonidal cyst. Also,  has a past surgical history that includes Pilonidal cyst excision; Tympanostomy tube placement; and laparoscopy (N/A, 01/31/2015)., family history includes Cancer in her maternal grandfather and paternal grandmother; Hyperlipidemia in her mother.,  reports that she has quit smoking. Her smoking use included cigarettes. She has a 0.50 pack-year smoking history. She has never used smokeless tobacco. She reports that she does not drink alcohol or use drugs. She has a current medication list which includes the following prescription(s): prenatal multivitamin. Also, has No Known Allergies. 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   Review of Systems  Constitutional: Negative for chills, fever and malaise/fatigue.  HENT: Negative for congestion, sinus pain and sore throat.   Eyes: Negative for blurred vision and pain.  Respiratory: Negative for cough and wheezing.   Cardiovascular: Negative for chest pain and leg swelling.  Gastrointestinal: Negative for abdominal pain, constipation, diarrhea, heartburn, nausea and vomiting.  Genitourinary: Negative for dysuria,  frequency, hematuria and urgency.  Musculoskeletal: Negative for back pain, joint pain, myalgias and neck pain.  Skin: Negative for itching and rash.  Neurological: Negative for dizziness, tremors and weakness.  Endo/Heme/Allergies: Does not bruise/bleed easily.  Psychiatric/Behavioral: Negative for depression. The patient is not nervous/anxious and does not have insomnia.     Objective: BP 112/62   Wt 194 lb (88 kg)   LMP  (LMP Unknown)   BMI 33.30 kg/m  Physical Exam  Constitutional: She is oriented to person, place, and time. She appears well-developed and well-nourished. No distress.  Genitourinary: Rectum normal, vagina normal and uterus normal. Pelvic exam was performed with patient supine. There is no rash or lesion on the right labia. There is no rash or lesion on the left labia. Vagina exhibits no lesion. No bleeding in the vagina. Right adnexum does not display mass and does not display tenderness. Left adnexum does not display mass and does not display tenderness. Cervix does not exhibit motion tenderness, lesion, friability or polyp.   Uterus is mobile and midaxial. Uterus is not enlarged or exhibiting a mass.  Genitourinary Comments: Cx 3/70/-3  HENT:  Head: Normocephalic and atraumatic. Head is without laceration.  Right Ear: Hearing normal.  Left Ear: Hearing normal.  Nose: No epistaxis.  No foreign bodies.  Mouth/Throat: Uvula is midline, oropharynx is clear and moist and mucous membranes are normal.  Eyes: Pupils are equal, round, and reactive to light.  Neck: Normal range of motion. Neck supple. No thyromegaly present.  Cardiovascular: Normal rate and regular rhythm. Exam reveals no gallop and no friction rub.  No murmur heard. Pulmonary/Chest: Effort normal and breath sounds normal. No respiratory distress. She has no wheezes. Right breast exhibits no mass, no skin change and no tenderness. Left breast exhibits no mass, no skin change and no tenderness.  Abdominal:  Soft. Bowel sounds are normal. She exhibits no distension. There is no tenderness. There is no rebound.  Gravid NT  Musculoskeletal: Normal range of motion.  Neurological: She is alert and oriented to person, place, and time. No cranial nerve deficit.  Skin: Skin is warm and dry.  Psychiatric: She has a normal mood and affect. Judgment normal.  Vitals reviewed.  Assessment: Term Pregnancy for Induction of Labor due to Postdates and Favorable cervix at term.  Plan: Patient will undergo induction of labor with pitocin.     Patient has been fully informed of the pros and cons, risks and benefits of continued observation with fetal monitoring versus that of induction of labor.   She understands that there are uncommon risks to induction, which include but are not limited to : frequent or prolonged uterine contractions, fetal distress, uterine rupture, and lack of successful induction.  These risks include all methods including Pitocin and Misoprostol and Cervadil.  Patient understands that using Misoprostol for labor induction is an "off label" indication although it has been studied extensively for this purpose and is an accepted method of induction.  She also has been informed of the increased risks for Cesarean with induction and should induction not be successful.  Patient consents to the induction plan of management.  Plans to breast feed Plans IUD for contraception  Annamarie MajorPaul Lynnsey Barbara, MD, Merlinda FrederickFACOG [facility], [facility] 08/06/2017  1:51 PM

## 2017-08-06 NOTE — Progress Notes (Signed)
  [facility] INDUCTION ASSESS[facility] SCHEDULING Amanda GrillsCarly Ann Allen Feb 18, 1990 Medical record #: [id number] Phone #:  Home Phone [phone]  Mobile [phone]    [facility]atal Provider:[facility] Proposed admission date/time:7/18 at 0800 Method of induction:Pitocin  Weight: Filed Weights07/16/19 1324Weight:194 lb (88 kg) BMI Body mass index is 33.3 kg/m. HIV Negative HSV none EDC Estimated Date of Delivery: 7/15/19based on:US at [redacted] wks  Gestational age on admission: 340 3/7 Gravidity/parity:G2P1001  Cervix Score   0 1 2 3   Position Posterior Midposition Anterior   Consistency Firm Medium Soft   Effacement (%) 0-30 40-50 60-70 >80  Dilation (cm) Closed 1-2 3-4 >5  Baby's station -3 -2 -1 +1, +2   Bishop Score:6   Medical induction of labor  select indication(s) below Elective induction ?39 weeks multiparous patient   Provider Signature: Letitia LibraRobert Paul Isaac Dubie Scheduled XB:MWUXLby:Jamie Date:08/06/2017 1:45 PM   Call [phone] to finalize the induction date/time  [id number] (07/17)

## 2017-08-07 ENCOUNTER — Encounter: Payer: BC Managed Care – PPO | Admitting: Certified Nurse Midwife

## 2017-08-08 ENCOUNTER — Other Ambulatory Visit: Payer: Self-pay

## 2017-08-08 ENCOUNTER — Inpatient Hospital Stay: Payer: BC Managed Care – PPO | Admitting: Anesthesiology

## 2017-08-08 ENCOUNTER — Inpatient Hospital Stay
Admission: EM | Admit: 2017-08-08 | Discharge: 2017-08-10 | DRG: 806 | Disposition: A | Discharge status: Home / Self Care | Payer: BC Managed Care – PPO | Attending: Obstetrics & Gynecology | Admitting: Obstetrics & Gynecology

## 2017-08-08 DIAGNOSIS — O9081 Anemia of the puerperium: Secondary | ICD-10-CM | POA: Diagnosis not present

## 2017-08-08 DIAGNOSIS — O1205 Gestational edema, complicating the puerperium: Secondary | ICD-10-CM | POA: Diagnosis present

## 2017-08-08 DIAGNOSIS — Z3A40 40 weeks gestation of pregnancy: Secondary | ICD-10-CM

## 2017-08-08 DIAGNOSIS — O26899 Other specified pregnancy related conditions, unspecified trimester: Secondary | ICD-10-CM

## 2017-08-08 DIAGNOSIS — O99824 Streptococcus B carrier state complicating childbirth: Secondary | ICD-10-CM | POA: Diagnosis present

## 2017-08-08 DIAGNOSIS — D62 Acute posthemorrhagic anemia: Secondary | ICD-10-CM | POA: Diagnosis not present

## 2017-08-08 DIAGNOSIS — Z6791 Unspecified blood type, Rh negative: Secondary | ICD-10-CM

## 2017-08-08 DIAGNOSIS — IMO0002 Reserved for concepts with insufficient information to code with codable children: Secondary | ICD-10-CM

## 2017-08-08 DIAGNOSIS — Z3480 Encounter for supervision of other normal pregnancy, unspecified trimester: Secondary | ICD-10-CM

## 2017-08-08 DIAGNOSIS — O480 Post-term pregnancy: Principal | ICD-10-CM | POA: Diagnosis present

## 2017-08-08 LAB — CBC
HEMATOCRIT: 36.9 % (ref 35.0–47.0)
HEMOGLOBIN: 13 g/dL (ref 12.0–16.0)
MCH: 31.9 pg (ref 26.0–34.0)
MCHC: 35.1 g/dL (ref 32.0–36.0)
MCV: 90.8 fL (ref 80.0–100.0)
Platelets: 195 10*3/uL (ref 150–440)
RBC: 4.07 MIL/uL (ref 3.80–5.20)
RDW: 13.9 % (ref 11.5–14.5)
WBC: 8.4 10*3/uL (ref 3.6–11.0)

## 2017-08-08 LAB — ABO/RH: ABO/RH(D): B NEG

## 2017-08-08 MED ORDER — LACTATED RINGERS IV SOLN
INTRAVENOUS | Status: DC
  Administered 2017-08-08: 09:00:00 via INTRAVENOUS

## 2017-08-08 MED ORDER — BUTORPHANOL TARTRATE 2 MG/ML IJ SOLN: 1.0000 mg | INTRAMUSCULAR | Status: DC | PRN

## 2017-08-08 MED ORDER — OXYTOCIN 40 UNITS IN LACTATED RINGERS INFUSION - SIMPLE MED: 2.5000 [IU]/h | INTRAVENOUS | Status: DC

## 2017-08-08 MED ORDER — DIPHENHYDRAMINE HCL 50 MG/ML IJ SOLN: 12.5000 mg | INTRAMUSCULAR | Status: DC | PRN

## 2017-08-08 MED ORDER — LACTATED RINGERS IV SOLN: 500.0000 mL | Freq: Once | INTRAVENOUS | Status: DC

## 2017-08-08 MED ORDER — IBUPROFEN 600 MG PO TABS
600.0000 mg | ORAL_TABLET | Freq: Four times a day (QID) | ORAL | Status: DC
  Administered 2017-08-08: 600 mg via ORAL

## 2017-08-08 MED ORDER — BENZOCAINE-MENTHOL 20-0.5 % EX AERO
1.0000 "application " | INHALATION_SPRAY | CUTANEOUS | Status: DC | PRN
  Administered 2017-08-10: 1 via TOPICAL
  Filled 2017-08-08 (×2): qty 56

## 2017-08-08 MED ORDER — AMMONIA AROMATIC IN INHA
RESPIRATORY_TRACT | Status: AC
  Filled 2017-08-08: qty 10

## 2017-08-08 MED ORDER — LIDOCAINE HCL (PF) 1 % IJ SOLN
INTRAMUSCULAR | Status: DC | PRN
  Administered 2017-08-08: 1.2 mL via SUBCUTANEOUS

## 2017-08-08 MED ORDER — FENTANYL 2.5 MCG/ML W/ROPIVACAINE 0.15% IN NS 100 ML EPIDURAL (ARMC)
EPIDURAL | Status: AC
  Filled 2017-08-08: qty 100

## 2017-08-08 MED ORDER — OXYTOCIN 40 UNITS IN LACTATED RINGERS INFUSION - SIMPLE MED
1.0000 m[IU]/min | INTRAVENOUS | Status: DC
  Administered 2017-08-08: 2 m[IU]/min via INTRAVENOUS
  Filled 2017-08-08: qty 1000

## 2017-08-08 MED ORDER — OXYTOCIN 40 UNITS IN LACTATED RINGERS INFUSION - SIMPLE MED
INTRAVENOUS | Status: AC
  Administered 2017-08-09
  Filled 2017-08-08: qty 1000

## 2017-08-08 MED ORDER — EPHEDRINE 5 MG/ML INJ: 10.0000 mg | INTRAVENOUS | Status: DC | PRN

## 2017-08-08 MED ORDER — FENTANYL 2.5 MCG/ML W/ROPIVACAINE 0.15% IN NS 100 ML EPIDURAL (ARMC): 12.0000 mL/h | EPIDURAL | Status: DC

## 2017-08-08 MED ORDER — TERBUTALINE SULFATE 1 MG/ML IJ SOLN: 0.2500 mg | Freq: Once | INTRAMUSCULAR | Status: DC | PRN

## 2017-08-08 MED ORDER — MISOPROSTOL 200 MCG PO TABS
ORAL_TABLET | ORAL | Status: AC
  Filled 2017-08-08: qty 4

## 2017-08-08 MED ORDER — LIDOCAINE-EPINEPHRINE (PF) 1.5 %-1:200000 IJ SOLN
INTRAMUSCULAR | Status: DC | PRN
  Administered 2017-08-08: 3 mL via EPIDURAL

## 2017-08-08 MED ORDER — PHENYLEPHRINE 40 MCG/ML (10ML) SYRINGE FOR IV PUSH (FOR BLOOD PRESSURE SUPPORT): 80.0000 ug | PREFILLED_SYRINGE | INTRAVENOUS | Status: DC | PRN

## 2017-08-08 MED ORDER — ONDANSETRON HCL 4 MG/2ML IJ SOLN: 4.0000 mg | Freq: Four times a day (QID) | INTRAMUSCULAR | Status: DC | PRN

## 2017-08-08 MED ORDER — ACETAMINOPHEN 325 MG PO TABS
650.0000 mg | ORAL_TABLET | ORAL | Status: DC | PRN
  Administered 2017-08-08 – 2017-08-10 (×5): 650 mg via ORAL
  Filled 2017-08-08 (×4): qty 2

## 2017-08-08 MED ORDER — IBUPROFEN 600 MG PO TABS
ORAL_TABLET | ORAL | Status: AC
  Administered 2017-08-08: 600 mg via ORAL
  Filled 2017-08-08: qty 1

## 2017-08-08 MED ORDER — OXYTOCIN 10 UNIT/ML IJ SOLN
INTRAMUSCULAR | Status: AC
  Filled 2017-08-08: qty 2

## 2017-08-08 MED ORDER — PENICILLIN G POT IN DEXTROSE 60000 UNIT/ML IV SOLN
3.0000 10*6.[IU] | INTRAVENOUS | Status: DC
  Administered 2017-08-08 (×2): 3 10*6.[IU] via INTRAVENOUS
  Filled 2017-08-08 (×2): qty 50

## 2017-08-08 MED ORDER — LIDOCAINE HCL (PF) 1 % IJ SOLN
INTRAMUSCULAR | Status: AC
  Filled 2017-08-08: qty 30

## 2017-08-08 MED ORDER — ACETAMINOPHEN 325 MG PO TABS
650.0000 mg | ORAL_TABLET | ORAL | Status: DC | PRN
  Filled 2017-08-08: qty 2

## 2017-08-08 MED ORDER — SODIUM CHLORIDE 0.9 % IV SOLN
5.0000 10*6.[IU] | Freq: Once | INTRAVENOUS | Status: AC
  Administered 2017-08-08: 5 10*6.[IU] via INTRAVENOUS

## 2017-08-08 MED ORDER — LACTATED RINGERS IV SOLN: 500.0000 mL | INTRAVENOUS | Status: DC | PRN

## 2017-08-08 MED ORDER — SODIUM CHLORIDE 0.9 % IV SOLN
INTRAVENOUS | Status: AC
  Filled 2017-08-08: qty 5

## 2017-08-08 MED ORDER — FENTANYL 2.5 MCG/ML W/ROPIVACAINE 0.15% IN NS 100 ML EPIDURAL (ARMC)
EPIDURAL | Status: DC | PRN
  Administered 2017-08-08: 12 mL/h via EPIDURAL

## 2017-08-08 MED ORDER — OXYTOCIN BOLUS FROM INFUSION
500.0000 mL | Freq: Once | INTRAVENOUS | Status: AC
  Administered 2017-08-08: 500 mL via INTRAVENOUS

## 2017-08-08 NOTE — Discharge Summary (Signed)
OB Discharge Summary     Patient Name: Amanda Allen DOB: Jul 31, 1990 MRN: [id number]  Date of admission: 08/08/2017 Delivering MD: Farrel Conners, CNM  Date of Delivery: 08/08/2017  Date of discharge:08/10/2017 Admitting diagnosis: 40 wks preg Intrauterine pregnancy: [redacted]w[redacted]d     Secondary diagnosis: None     Discharge diagnosis: Term Pregnancy Delivered, No other diagnosis                         Hospital course:  Induction of Labor With Vaginal Delivery   27 y.o. yo G2P1001 at [redacted]w[redacted]d was admitted to the hospital 08/08/2017 for induction of labor.  Indication for induction: Postdates and Favorable cervix at term.  Patient had an uncomplicated labor course as follows: Membrane Rupture Time/Date: 3:14 PM ,08/08/2017   Intrapartum Procedures: Episiotomy: None [1]                                         Lacerations:  Vaginal [6];Periurethral [8]  Patient had delivery of a Viable infant.  Information for the patient's newborn:  Nyra, Anspaugh [[id number]]  Delivery Method: Vag-Spont   08/08/2017  Details of delivery can be found in separate delivery note.  Patient had a routine postpartum course. Patient is discharged home 08/10/2017                                                              Post partum procedures:none  Complications: None  Physical exam on 08/10/2017: Vitals:   08/09/17 1211 08/09/17 2103 08/09/17 2300 08/10/17 0746  BP: 123/83 119/74 131/78 110/71  Pulse: 88 69 87 68  Resp: 20 20 20 20   Temp: 98.3 F (36.8 C) 98 F (36.7 C) 98.2 F (36.8 C) 98.3 F (36.8 C)  TempSrc: Oral Oral Oral Oral  SpO2:  98% 100% 99%  Weight:      Height:       General: alert, cooperative and no distress         Lochia: appropriate Uterine Fundus: firm Vulva: right labia majora slightly swollen, no hematoma seen DVT Evaluation: No evidence of DVT seen on physical exam. Bilateral pedal and LE edema present.  Labs: Lab Results  Component Value Date   WBC 13.5 (H) 08/09/2017   HGB 12.6 08/09/2017   HCT 35.5 08/09/2017   MCV 90.2 08/09/2017   PLT 186 08/09/2017   CMP Latest Ref Rng & Units 04/30/2017  Glucose 65 - 99 mg/dL 83  BUN 6 - 20 mg/dL 6  Creatinine 7.82 - 9.56 mg/dL 2.13  Sodium 086 - 578 mmol/L 140  Potassium 3.5 - 5.2 mmol/L 3.6  Chloride 96 - 106 mmol/L 106  CO2 20 - 29 mmol/L 20  Calcium 8.7 - 10.2 mg/dL 9.2  Total Protein 6.0 - 8.5 g/dL 6.3  Total Bilirubin 0.0 - 1.2 mg/dL <4.6  Alkaline Phos 39 - 117 IU/L 74  AST 0 - 40 IU/L 16  ALT 0 - 32 IU/L 12    Discharge instruction: per After Visit Summary.  Medications:  Allergies as of 08/10/2017   No Known Allergies     Medication List    TAKE these medications  ibuprofen 600 MG tablet Commonly known as:  ADVIL,MOTRIN Take 1 tablet (600 mg total) by mouth every 6 (six) hours as needed.   prenatal multivitamin Tabs tablet Take 1 tablet by mouth daily at 12 noon.       Diet: routine diet  Activity: Advance as tolerated. Pelvic rest for 6 weeks.   Outpatient follow up: Follow-up Information    Nadara MustardHarris, Robert P, MD. Schedule an appointment as soon as possible for a visit in 6 week(s).   Specialty:  Obstetrics and Gynecology Contact information: [address] [place] [address] [phone]             Postpartum contraception: Paraguard IUD Rhogam Given postpartum: no. Baby B negative Rubella vaccine given postpartum: no Varicella vaccine given postpartum: no TDaP given antepartum or postpartum: Yes, given 06/05/2017  Newborn Data: Live born female. Amelia Birth Weight: 7 lb 15 oz (3600 g) APGAR: 8, 9  Newborn Delivery   Birth date/time:  08/08/2017 20:31:00 Delivery type:  Vaginal, Spontaneous      Baby Feeding: Breast  Disposition:home with mother  SIGNED:  Farrel ConnersColleen Trenton Passow, [facility] 08/10/2017 8:46 AM

## 2017-08-08 NOTE — Progress Notes (Signed)
  Labor Progress Note   27 y.o. G2P1001 @ 5468w3d , admitted for  Pregnancy, Labor Management.   Subjective:  IOL Post Dates, min pain  Objective:  BP 124/63 (BP Location: Left Arm)   Pulse 91   Temp 97.9 F (36.6 C) (Oral)   Resp 16   Ht 5\' 4"  (1.626 m)   Wt 194 lb (88 kg)   LMP  (LMP Unknown)   SpO2 96%   BMI 33.30 kg/m  Abd: mild Extr: trace to 1+ bilateral pedal edema SVE: CERVIX: 4 cm dilated, 70 effaced, -2 station AROM CLEAR  EFM: FHR: 140 bpm, variability: moderate,  accelerations:  Present,  decelerations:  Absent Toco: Frequency: Every 2-3 minutes Labs: I have reviewed the patient's lab results.   Assessment & Plan:  G2P1001 @ 6268w3d, admitted for  Pregnancy and Labor/Delivery Management  1. Pain management: none. 2. FWB: FHT category 1.  3. ID: GBS positive 4. Labor management: AROM, Pitocin  All discussed with patient, see orders  Annamarie MajorPaul Leslyn Monda, MD, Merlinda FrederickFACOG [facility], [facility] 08/08/2017  3:15 PM

## 2017-08-08 NOTE — Anesthesia Procedure Notes (Signed)
Epidural Patient location during procedure: OB Start time: 08/08/2017 4:57 PM End time: 08/08/2017 5:16 PM  Staffing Performed: anesthesiologist   Preanesthetic Checklist Completed: patient identified, site marked, surgical consent, pre-op evaluation, timeout performed, IV checked, risks and benefits discussed and monitors and equipment checked  Epidural Patient position: sitting Prep: Betadine Patient monitoring: heart rate, continuous pulse ox and blood pressure Approach: midline Location: L4-L5 Injection technique: LOR saline  Needle:  Needle type: Tuohy  Needle gauge: 17 G Needle length: 9 cm and 9 Needle insertion depth: 8 cm Catheter type: closed end flexible Catheter size: 20 Guage Catheter at skin depth: 11 cm Test dose: negative and 1.5% lidocaine with Epi 1:200 K  Assessment Events: blood not aspirated, injection not painful, no injection resistance, negative IV test and no paresthesia  Additional Notes   Patient tolerated the insertion well without complications.Reason for block:procedure for pain

## 2017-08-08 NOTE — Progress Notes (Signed)
  Labor Progress Note   27 y.o. G2P1001 @ 2967w3d , admitted for  Pregnancy, Labor Management.   Subjective:  Min pain, on Pitocin 8 mU/min ABX in for GBS  Objective:  BP 124/63 (BP Location: Left Arm)   Pulse 91   Temp 97.9 F (36.6 C) (Oral)   Resp 16   Ht 5\' 4"  (1.626 m)   Wt 194 lb (88 kg)   LMP  (LMP Unknown)   SpO2 96%   BMI 33.30 kg/m  EFM: FHR: 140 bpm, variability: moderate,  accelerations:  Present,  decelerations:  Absent Toco: Frequency: Every 3-5 minutes Labs: I have reviewed the patient's lab results.   Assessment & Plan:  G2P1001 @ 2167w3d, admitted for  Pregnancy and Labor/Delivery Management  1. Pain management: none. 2. FWB: FHT category 1.  3. ID: GBS positive 4. Labor management: Cont Pitocin. AROM when appropriate Plans Epidural  All discussed with patient, see orders  Annamarie MajorPaul Kee Drudge, MD, Merlinda FrederickFACOG [facility], [facility] 08/08/2017  1:38 PM

## 2017-08-08 NOTE — Progress Notes (Signed)
  Labor Progress Note   27 y.o. G2P1001 @ 7069w3d , admitted for  Pregnancy, Labor Management.   Subjective:  Epidural, comfortable  Objective:  BP 124/63 (BP Location: Left Arm)   Pulse 91   Temp 97.9 F (36.6 C) (Oral)   Resp 16   Ht 5\' 4"  (1.626 m)   Wt 194 lb (88 kg)   LMP  (LMP Unknown)   SpO2 96%   BMI 33.30 kg/m  Abd: mild Extr: trace to 1+ bilateral pedal edema SVE: CERVIX: 7 cm dilated, 90 effaced, -1 station  EFM: FHR: 120 bpm, variability: moderate,  accelerations:  Present,  decelerations:  Absent Toco: Frequency: Every 2-3 minutes Labs: I have reviewed the patient's lab results.   Assessment & Plan:  G2P1001 @ 6169w3d, admitted for  Pregnancy and Labor/Delivery Management  1. Pain management: epidural. 2. FWB: FHT category 1.  3. ID: GBS positive 4. Labor management: IUPC as Pitocin 20 mU/ Making progress  All discussed with patient, see orders  Amanda MajorPaul Feliciano [name], MD, Merlinda FrederickFACOG [facility], [facility] 08/08/2017  6:20 PM

## 2017-08-08 NOTE — H&P (Signed)
History and Physical Interval Note:  08/08/2017 8:25 AM  Mical Letitia CaulAnn Mccaffery  has presented today for INDUCTION OF LABOR (pitocin),  with the diagnosis of Postdates and Favorable cervix at term. The various methods of treatment have been discussed with the patient and family. After consideration of risks, benefits and other options for treatment, the patient has consented to  Labor induction .  The patient's history has been reviewed, patient examined, no change in status, and is stable for induction as planned.  See H&P. I have reviewed the patient's chart and labs.  Questions were answered to the patient's satisfaction.    Prior NSVD, meconium GBS pos so ABX first, then Pitocin and eventual AROM Epidural when appropriate Breast feeding plans IUD plans  Annamarie MajorPaul Clarann Helvey, MD, Merlinda FrederickFACOG [facility], [facility] 08/08/2017  8:25 AM

## 2017-08-08 NOTE — Anesthesia Preprocedure Evaluation (Signed)
Anesthesia Evaluation  Patient identified by MRN, date of birth, ID band Patient awake    Reviewed: Allergy & Precautions, NPO status , Patient's Chart, lab work & pertinent test results  History of Anesthesia Complications Negative for: history of anesthetic complications  Airway Mallampati: II       Dental   Pulmonary neg sleep apnea, neg COPD, former smoker,           Cardiovascular (-) hypertension(-) Past MI and (-) CHF (-) dysrhythmias (-) Valvular Problems/Murmurs     Neuro/Psych neg Seizures Anxiety    GI/Hepatic Neg liver ROS, neg GERD  ,  Endo/Other  neg diabetes  Renal/GU negative Renal ROS     Musculoskeletal   Abdominal   Peds  Hematology   Anesthesia Other Findings   Reproductive/Obstetrics                             Anesthesia Physical Anesthesia Plan  ASA: II  Anesthesia Plan: Epidural   Post-op Pain Management:    Induction:   PONV Risk Score and Plan:   Airway Management Planned:   Additional Equipment:   Intra-op Plan:   Post-operative Plan:   Informed Consent: I have reviewed the patients History and Physical, chart, labs and discussed the procedure including the risks, benefits and alternatives for the proposed anesthesia with the patient or authorized representative who has indicated his/her understanding and acceptance.     Plan Discussed with:   Anesthesia Plan Comments:         Anesthesia Quick Evaluation

## 2017-08-09 LAB — CBC
HEMATOCRIT: 35.5 % (ref 35.0–47.0)
HEMOGLOBIN: 12.6 g/dL (ref 12.0–16.0)
MCH: 32.1 pg (ref 26.0–34.0)
MCHC: 35.6 g/dL (ref 32.0–36.0)
MCV: 90.2 fL (ref 80.0–100.0)
Platelets: 186 10*3/uL (ref 150–440)
RBC: 3.93 MIL/uL (ref 3.80–5.20)
RDW: 13.8 % (ref 11.5–14.5)
WBC: 13.5 10*3/uL — ABNORMAL HIGH (ref 3.6–11.0)

## 2017-08-09 LAB — RPR: RPR Ser Ql: NONREACTIVE

## 2017-08-09 MED ORDER — OXYCODONE-ACETAMINOPHEN 5-325 MG PO TABS: 1.0000 | ORAL_TABLET | ORAL | Status: DC | PRN

## 2017-08-09 MED ORDER — WITCH HAZEL-GLYCERIN EX PADS: 1.0000 "application " | MEDICATED_PAD | CUTANEOUS | Status: DC | PRN

## 2017-08-09 MED ORDER — IBUPROFEN 600 MG PO TABS
600.0000 mg | ORAL_TABLET | Freq: Four times a day (QID) | ORAL | Status: DC
  Administered 2017-08-09 – 2017-08-10 (×6): 600 mg via ORAL
  Filled 2017-08-09 (×7): qty 1

## 2017-08-09 MED ORDER — DIPHENHYDRAMINE HCL 25 MG PO CAPS: 25.0000 mg | ORAL_CAPSULE | Freq: Four times a day (QID) | ORAL | Status: DC | PRN

## 2017-08-09 MED ORDER — ONDANSETRON HCL 4 MG PO TABS: 4.0000 mg | ORAL_TABLET | ORAL | Status: DC | PRN

## 2017-08-09 MED ORDER — ONDANSETRON HCL 4 MG/2ML IJ SOLN: 4.0000 mg | INTRAMUSCULAR | Status: DC | PRN

## 2017-08-09 MED ORDER — SODIUM CHLORIDE 0.9 % IV SOLN: 250.0000 mL | INTRAVENOUS | Status: DC | PRN

## 2017-08-09 MED ORDER — COCONUT OIL OIL
1.0000 "application " | TOPICAL_OIL | Status: DC | PRN
  Administered 2017-08-09: 1 via TOPICAL
  Filled 2017-08-09 (×2): qty 120

## 2017-08-09 MED ORDER — OXYCODONE-ACETAMINOPHEN 5-325 MG PO TABS: 2.0000 | ORAL_TABLET | ORAL | Status: DC | PRN

## 2017-08-09 MED ORDER — SODIUM CHLORIDE 0.9% FLUSH: 3.0000 mL | INTRAVENOUS | Status: DC | PRN

## 2017-08-09 MED ORDER — DIBUCAINE 1 % RE OINT: 1.0000 "application " | TOPICAL_OINTMENT | RECTAL | Status: DC | PRN

## 2017-08-09 MED ORDER — SENNOSIDES-DOCUSATE SODIUM 8.6-50 MG PO TABS: 2.0000 | ORAL_TABLET | ORAL | Status: DC

## 2017-08-09 MED ORDER — SIMETHICONE 80 MG PO CHEW: 80.0000 mg | CHEWABLE_TABLET | ORAL | Status: DC | PRN

## 2017-08-09 MED ORDER — SODIUM CHLORIDE 0.9% FLUSH: 3.0000 mL | Freq: Two times a day (BID) | INTRAVENOUS | Status: DC

## 2017-08-09 MED ORDER — ZOLPIDEM TARTRATE 5 MG PO TABS: 5.0000 mg | ORAL_TABLET | Freq: Every evening | ORAL | Status: DC | PRN

## 2017-08-09 MED ORDER — SENNOSIDES-DOCUSATE SODIUM 8.6-50 MG PO TABS
2.0000 | ORAL_TABLET | ORAL | Status: DC
  Administered 2017-08-09 – 2017-08-10 (×2): 2 via ORAL
  Filled 2017-08-09 (×2): qty 2

## 2017-08-09 MED ORDER — FAMOTIDINE 20 MG PO TABS
20.0000 mg | ORAL_TABLET | Freq: Two times a day (BID) | ORAL | Status: DC
  Administered 2017-08-09 – 2017-08-10 (×3): 20 mg via ORAL
  Filled 2017-08-09 (×3): qty 1

## 2017-08-09 NOTE — Progress Notes (Signed)
PPD#1 SVD Subjective:  Sitting up in bed, breastfeeding baby. Pain control is adequate. Voiding without difficulty. Tolerating a regular diet. Ambulating well.  Objective:  Blood pressure 130/86, pulse 69, temperature 98.1 F (36.7 C), temperature source Oral, resp. rate 20, height 5\' 4"  (1.626 m), weight 194 lb (88 kg), SpO2 99 %.  General: NAD Pulmonary: no increased work of breathing Abdomen: non-distended, non-tender Uterus:  fundus firm at U; lochia rubra small Extremities: no edema, no erythema, no tenderness, no signs of DVT  Results for orders placed or performed during the hospital encounter of 08/08/17 (from the past 72 hour(s))  CBC     Status: None   Collection Time: 08/08/17  8:50 AM  Result Value Ref Range   WBC 8.4 3.6 - 11.0 K/uL   RBC 4.07 3.80 - 5.20 MIL/uL   Hemoglobin 13.0 12.0 - 16.0 g/dL   HCT 16.136.9 09.635.0 - 04.547.0 %   MCV 90.8 80.0 - 100.0 fL   MCH 31.9 26.0 - 34.0 pg   MCHC 35.1 32.0 - 36.0 g/dL   RDW 40.913.9 81.111.5 - 91.414.5 %   Platelets 195 150 - 440 K/uL    Comment: Performed at [facility], [address] [place], [place] [address]  RPR     Status: None   Collection Time: 08/08/17  8:50 AM  Result Value Ref Range   RPR Ser Ql Non Reactive Non Reactive    Comment: (NOTE) Performed At: [facility] [address] [place], [place] [id number] Jolene SchimkeNagendra Sanjai MD QI:[phone]   Type and screen     Status: None (Preliminary result)   Collection Time: 08/08/17  8:50 AM  Result Value Ref Range   ABO/RH(D) B NEG    Antibody Screen POS    Sample Expiration 08/11/2017    Antibody Identification      PASSIVELY ACQUIRED ANTI-D Performed at [facility], [address] [place], [place] [address]    Unit Number [id number]    Blood Component Type RED CELLS,LR    Unit division 00    Status of Unit ALLOCATED    Transfusion Status OK TO TRANSFUSE    Crossmatch Result COMPATIBLE    Unit Number [id number]    Blood Component Type RED  CELLS,LR    Unit division 00    Status of Unit ALLOCATED    Transfusion Status OK TO TRANSFUSE    Crossmatch Result COMPATIBLE   ABO/Rh     Status: None   Collection Time: 08/08/17 12:20 PM  Result Value Ref Range   ABO/RH(D)      B NEG Performed at [facility], [address] [place], [place] [address]   CBC     Status: Abnormal   Collection Time: 08/09/17  5:12 AM  Result Value Ref Range   WBC 13.5 (H) 3.6 - 11.0 K/uL   RBC 3.93 3.80 - 5.20 MIL/uL   Hemoglobin 12.6 12.0 - 16.0 g/dL   HCT 51.835.5 84.135.0 - 66.047.0 %   MCV 90.2 80.0 - 100.0 fL   MCH 32.1 26.0 - 34.0 pg   MCHC 35.6 32.0 - 36.0 g/dL   RDW 63.013.8 16.011.5 - 10.914.5 %   Platelets 186 150 - 440 K/uL    Comment: Performed at [facility], [address] [place], [place] [address]    Assessment:   27 y.o. G2P1001 postpartum day # 1 recovering well.  Plan:  1) Acute blood loss anemia - hemodynamically stable and asymptomatic  2) Blood  Type --/--/B NEG Performed at [facility], [address] [place], [place] [address]  [phone])   Information for the patient's newborn:  Starletta, Houchin Girl Sundae [[id number]]  B NEG RhoGAM not indicated.  3) Rubella 1.11 (11/21 1532) / Varicella immune / TDAP status: given 06/05/2017   4) Breast feeding  5) Contraception: IUD  6) Disposition: continue postpartum care.  Marcelyn Bruins, CNM 08/09/2017  10:05 AM

## 2017-08-09 NOTE — Lactation Note (Signed)
This note was copied from a baby's chart. Lactation Consultation Note  Patient Name: Amanda Allen Today's Date: 08/09/2017     Maternal Data    Feeding    LATCH Score                   Interventions    Lactation Tools Discussed/Used     Consult Status  LC called to room to assist with latch and assisted infant with latch to right breast. Infant was able to latch and lips was adjusted to flange out. Parents taught to pull chin down to flange lips for a deeper latch.    Arlyss Gandylicia Adrianah Prophete 08/09/2017, 4:28 PM

## 2017-08-09 NOTE — Anesthesia Postprocedure Evaluation (Signed)
Anesthesia Post Note  Patient: Amanda Allen  Procedure(s) Performed: AN AD HOC LABOR EPIDURAL  Patient location during evaluation: Mother Baby Anesthesia Type: Epidural Level of consciousness: awake and alert Pain management: pain level controlled Vital Signs Assessment: post-procedure vital signs reviewed and stable Respiratory status: spontaneous breathing Cardiovascular status: stable Postop Assessment: no headache, no backache, patient able to bend at knees, no apparent nausea or vomiting, adequate PO intake and able to ambulate Anesthetic complications: no     Last Vitals:  Vitals:   08/09/17 0052 08/09/17 0316  BP: 119/60 108/62  Pulse: 71 62  Resp: 18 18  Temp: 36.7 C 36.7 C  SpO2: 99% 99%    Last Pain:  Vitals:   08/09/17 0555  TempSrc:   PainSc: 0-No pain                 Zachary GeorgeWeatherly,  Laporsha Grealish F

## 2017-08-10 ENCOUNTER — Encounter: Payer: Self-pay | Admitting: Lactation Services

## 2017-08-10 LAB — TYPE AND SCREEN
ABO/RH(D): B NEG
ANTIBODY SCREEN: POSITIVE
Unit division: 0
Unit division: 0

## 2017-08-10 LAB — BPAM RBC
BLOOD PRODUCT EXPIRATION DATE: 201908212359
BLOOD PRODUCT EXPIRATION DATE: 201908222359
Unit Type and Rh: 9500
Unit Type and Rh: 9500

## 2017-08-10 MED ORDER — IBUPROFEN 600 MG PO TABS: 600.0000 mg | ORAL_TABLET | Freq: Four times a day (QID) | ORAL | 0 refills | Status: DC | PRN

## 2017-08-10 NOTE — Discharge Instructions (Signed)
Breastfeeding Choosing to breastfeed is one of the best decisions you can make for yourself and your baby. A change in hormones during pregnancy causes your breasts to make breast milk in your milk-producing glands. Hormones prevent breast milk from being released before your baby is born. They also prompt milk flow after birth. Once breastfeeding has begun, thoughts of your baby, as well as his or her sucking or crying, can stimulate the release of milk from your milk-producing glands. Benefits of breastfeeding Research shows that breastfeeding offers many health benefits for infants and mothers. It also offers a cost-free and convenient way to feed your baby. For your baby Your first milk (colostrum) helps your baby's digestive system to function better. Special cells in your milk (antibodies) help your baby to fight off infections. Breastfed babies are less likely to develop asthma, allergies, obesity, or type 2 diabetes. They are also at lower risk for sudden infant death syndrome (SIDS). Nutrients in breast milk are better able to meet your babys needs compared to infant formula. Breast milk improves your baby's brain development. For you Breastfeeding helps to create a very special bond between you and your baby. Breastfeeding is convenient. Breast milk costs nothing and is always available at the correct temperature. Breastfeeding helps to burn calories. It helps you to lose the weight that you gained during pregnancy. Breastfeeding makes your uterus return faster to its size before pregnancy. It also slows bleeding (lochia) after you give birth. Breastfeeding helps to lower your risk of developing type 2 diabetes, osteoporosis, rheumatoid arthritis, cardiovascular disease, and breast, ovarian, uterine, and endometrial cancer later in life. Breastfeeding basics Starting breastfeeding Find a comfortable place to sit or lie down, with your neck and back well-supported. Place a pillow or a  rolled-up blanket under your baby to bring him or her to the level of your breast (if you are seated). Nursing pillows are specially designed to help support your arms and your baby while you breastfeed. Make sure that your baby's tummy (abdomen) is facing your abdomen. Gently massage your breast. With your fingertips, massage from the outer edges of your breast inward toward the nipple. This encourages milk flow. If your milk flows slowly, you may need to continue this action during the feeding. Support your breast with 4 fingers underneath and your thumb above your nipple (make the letter "C" with your hand). Make sure your fingers are well away from your nipple and your babys mouth. Stroke your baby's lips gently with your finger or nipple. When your baby's mouth is open wide enough, quickly bring your baby to your breast, placing your entire nipple and as much of the areola as possible into your baby's mouth. The areola is the colored area around your nipple. More areola should be visible above your baby's upper lip than below the lower lip. Your baby's lips should be opened and extended outward (flanged) to ensure an adequate, comfortable latch. Your baby's tongue should be between his or her lower gum and your breast. Make sure that your baby's mouth is correctly positioned around your nipple (latched). Your baby's lips should create a seal on your breast and be turned out (everted). It is common for your baby to suck about 2-3 minutes in order to start the flow of breast milk. Latching Teaching your baby how to latch onto your breast properly is very important. An improper latch can cause nipple pain, decreased milk supply, and poor weight gain in your baby. Also, if your baby  is not latched onto your nipple properly, he or she may swallow some air during feeding. This can make your baby fussy. Burping your baby when you switch breasts during the feeding can help to get rid of the air. However,  teaching your baby to latch on properly is still the best way to prevent fussiness from swallowing air while breastfeeding. Signs that your baby has successfully latched onto your nipple Silent tugging or silent sucking, without causing you pain. Infant's lips should be extended outward (flanged). Swallowing heard between every 3-4 sucks once your milk has started to flow (after your let-down milk reflex occurs). Muscle movement above and in front of his or her ears while sucking.  Signs that your baby has not successfully latched onto your nipple Sucking sounds or smacking sounds from your baby while breastfeeding. Nipple pain.  If you think your baby has not latched on correctly, slip your finger into the corner of your babys mouth to break the suction and place it between your baby's gums. Attempt to start breastfeeding again. Signs of successful breastfeeding Signs from your baby Your baby will gradually decrease the number of sucks or will completely stop sucking. Your baby will fall asleep. Your baby's body will relax. Your baby will retain a small amount of milk in his or her mouth. Your baby will let go of your breast by himself or herself.  Signs from you Breasts that have increased in firmness, weight, and size 1-3 hours after feeding. Breasts that are softer immediately after breastfeeding. Increased milk volume, as well as a change in milk consistency and color by the fifth day of breastfeeding. Nipples that are not sore, cracked, or bleeding.  Signs that your baby is getting enough milk Wetting at least 1-2 diapers during the first 24 hours after birth. Wetting at least 5-6 diapers every 24 hours for the first week after birth. The urine should be clear or pale yellow by the age of 5 days. Wetting 6-8 diapers every 24 hours as your baby continues to grow and develop. At least 3 stools in a 24-hour period by the age of 5 days. The stool should be soft and yellow. At least 3  stools in a 24-hour period by the age of 7 days. The stool should be seedy and yellow. No loss of weight greater than 10% of birth weight during the first 3 days of life. Average weight gain of 4-7 oz (113-198 g) per week after the age of 4 days. Consistent daily weight gain by the age of 5 days, without weight loss after the age of 2 weeks. After a feeding, your baby may spit up a small amount of milk. This is normal. Breastfeeding frequency and duration Frequent feeding will help you make more milk and can prevent sore nipples and extremely full breasts (breast engorgement). Breastfeed when you feel the need to reduce the fullness of your breasts or when your baby shows signs of hunger. This is called "breastfeeding on demand." Signs that your baby is hungry include: Increased alertness, activity, or restlessness. Movement of the head from side to side. Opening of the mouth when the corner of the mouth or cheek is stroked (rooting). Increased sucking sounds, smacking lips, cooing, sighing, or squeaking. Hand-to-mouth movements and sucking on fingers or hands. Fussing or crying.  Avoid introducing a pacifier to your baby in the first 4-6 weeks after your baby is born. After this time, you may choose to use a pacifier. Research has shown  that pacifier use during the first year of a baby's life decreases the risk of sudden infant death syndrome (SIDS). Allow your baby to feed on each breast as long as he or she wants. When your baby unlatches or falls asleep while feeding from the first breast, offer the second breast. Because newborns are often sleepy in the first few weeks of life, you may need to awaken your baby to get him or her to feed. Breastfeeding times will vary from baby to baby. However, the following rules can serve as a guide to help you make sure that your baby is properly fed: Newborns (babies 93 weeks of age or younger) may breastfeed every 1-3 hours. Newborns should not go without  breastfeeding for longer than 3 hours during the day or 5 hours during the night. You should breastfeed your baby a minimum of 8 times in a 24-hour period.  Breast milk pumping Pumping and storing breast milk allows you to make sure that your baby is exclusively fed your breast milk, even at times when you are unable to breastfeed. This is especially important if you go back to work while you are still breastfeeding, or if you are not able to be present during feedings. Your lactation consultant can help you find a method of pumping that works best for you and give you guidelines about how long it is safe to store breast milk. Caring for your breasts while you breastfeed Nipples can become dry, cracked, and sore while breastfeeding. The following recommendations can help keep your breasts moisturized and healthy: Avoid using soap on your nipples. Wear a supportive bra designed especially for nursing. Avoid wearing underwire-style bras or extremely tight bras (sports bras). Air-dry your nipples for 3-4 minutes after each feeding. Use only cotton bra pads to absorb leaked breast milk. Leaking of breast milk between feedings is normal. Use lanolin on your nipples after breastfeeding. Lanolin helps to maintain your skin's normal moisture barrier. Pure lanolin is not harmful (not toxic) to your baby. You may also hand express a few drops of breast milk and gently massage that milk into your nipples and allow the milk to air-dry.  In the first few weeks after giving birth, some women experience breast engorgement. Engorgement can make your breasts feel heavy, warm, and tender to the touch. Engorgement peaks within 3-5 days after you give birth. The following recommendations can help to ease engorgement: Completely empty your breasts while breastfeeding or pumping. You may want to start by applying warm, moist heat (in the shower or with warm, water-soaked hand towels) just before feeding or pumping. This  increases circulation and helps the milk flow. If your baby does not completely empty your breasts while breastfeeding, pump any extra milk after he or she is finished. Apply ice packs to your breasts immediately after breastfeeding or pumping, unless this is too uncomfortable for you. To do this: Put ice in a plastic bag. Place a towel between your skin and the bag. Leave the ice on for 20 minutes, 2-3 times a day. Make sure that your baby is latched on and positioned properly while breastfeeding.  If engorgement persists after 48 hours of following these recommendations, contact your health care provider or a Advertising copywriter. Overall health care recommendations while breastfeeding Eat 3 healthy meals and 3 snacks every day. Well-nourished mothers who are breastfeeding need an additional 450-500 calories a day. You can meet this requirement by increasing the amount of a balanced diet that you eat.  Drink enough water to keep your urine pale yellow or clear. Rest often, relax, and continue to take your prenatal vitamins to prevent fatigue, stress, and low vitamin and mineral levels in your body (nutrient deficiencies). Do not use any products that contain nicotine or tobacco, such as cigarettes and e-cigarettes. Your baby may be harmed by chemicals from cigarettes that pass into breast milk and exposure to secondhand smoke. If you need help quitting, ask your health care provider. Avoid alcohol. Do not use illegal drugs or marijuana. Talk with your health care provider before taking any medicines. These include over-the-counter and prescription medicines as well as vitamins and herbal supplements. Some medicines that may be harmful to your baby can pass through breast milk. It is possible to become pregnant while breastfeeding. If birth control is desired, ask your health care provider about options that will be safe while breastfeeding your baby. Where to find more information: [employer]: www.llli.org Contact a health care provider if: You feel like you want to stop breastfeeding or have become frustrated with breastfeeding. Your nipples are cracked or bleeding. Your breasts are red, tender, or warm. You have: Painful breasts or nipples. A swollen area on either breast. A fever or chills. Nausea or vomiting. Drainage other than breast milk from your nipples. Your breasts do not become full before feedings by the fifth day after you give birth. You feel sad and depressed. Your baby is: Too sleepy to eat well. Having trouble sleeping. More than 761 week old and wetting fewer than 6 diapers in a 24-hour period. Not gaining weight by 445 days of age. Your baby has fewer than 3 stools in a 24-hour period. Your baby's skin or the white parts of his or her eyes become yellow. Get help right away if: Your baby is overly tired (lethargic) and does not want to wake up and feed. Your baby develops an unexplained fever. Summary Breastfeeding offers many health benefits for infant and mothers. Try to breastfeed your infant when he or she shows early signs of hunger. Gently tickle or stroke your baby's lips with your finger or nipple to allow the baby to open his or her mouth. Bring the baby to your breast. Make sure that much of the areola is in your baby's mouth. Offer one side and burp the baby before you offer the other side. Talk with your health care provider or lactation consultant if you have questions or you face problems as you breastfeed. This information is not intended to replace advice given to you by your health care provider. Make sure you discuss any questions you have with your health care provider. Document Released: 01/08/2005 Document Revised: 02/10/2016 Document Reviewed: 02/10/2016 Elsevier Interactive Patient Education  2018 [employer] Inc. Vaginal Delivery, Care After Refer to this sheet in the next few weeks. These instructions provide you with  information about caring for yourself after vaginal delivery. Your health care provider may also give you more specific instructions. Your treatment has been planned according to current medical practices, but problems sometimes occur. Call your health care provider if you have any problems or questions. What can I expect after the procedure? After vaginal delivery, it is common to have:  Some bleeding from your vagina.  Soreness in your abdomen, your vagina, and the area of skin between your vaginal opening and your anus (perineum).  Pelvic cramps.  Fatigue.  Follow these instructions at home: Medicines  Take over-the-counter and prescription medicines only as told  by your health care provider.  If you were prescribed an antibiotic medicine, take it as told by your health care provider. Do not stop taking the antibiotic until it is finished. Driving   Do not drive or operate heavy machinery while taking prescription pain medicine.  Do not drive for 24 hours if you received a sedative. Lifestyle  Do not drink alcohol. This is especially important if you are breastfeeding or taking medicine to relieve pain.  Do not use tobacco products, including cigarettes, chewing tobacco, or e-cigarettes. If you need help quitting, ask your health care provider. Eating and drinking  Drink at least 8 eight-ounce glasses of water every day unless you are told not to by your health care provider. If you choose to breastfeed your baby, you may need to drink more water than this.  Eat high-fiber foods every day. These foods may help prevent or relieve constipation. High-fiber foods include: ? Whole grain cereals and breads. ? Brown rice. ? Beans. ? Fresh fruits and vegetables. Activity  Return to your normal activities as told by your health care provider. Ask your health care provider what activities are safe for you.  Rest as much as possible. Try to rest or take a nap when your baby is  sleeping.  Do not lift anything that is heavier than your baby or 10 lb (4.5 kg) until your health care provider says that it is safe.  Talk with your health care provider about when you can engage in sexual activity. This may depend on your: ? Risk of infection. ? Rate of healing. ? Comfort and desire to engage in sexual activity. Vaginal Care  If you have an episiotomy or a vaginal tear, check the area every day for signs of infection. Check for: ? More redness, swelling, or pain. ? More fluid or blood. ? Warmth. ? Pus or a bad smell.  Do not use tampons or douches until your health care provider says this is safe.  Watch for any blood clots that may pass from your vagina. These may look like clumps of dark red, brown, or black discharge. General instructions  Keep your perineum clean and dry as told by your health care provider.  Wear loose, comfortable clothing.  Wipe from front to back when you use the toilet.  Ask your health care provider if you can shower or take a bath. If you had an episiotomy or a perineal tear during labor and delivery, your health care provider may tell you not to take baths for a certain length of time.  Wear a bra that supports your breasts and fits you well.  If possible, have someone help you with household activities and help care for your baby for at least a few days after you leave the hospital.  Keep all follow-up visits for you and your baby as told by your health care provider. This is important. Contact a health care provider if:  You have: ? Vaginal discharge that has a bad smell. ? Difficulty urinating. ? Pain when urinating. ? A sudden increase or decrease in the frequency of your bowel movements. ? More redness, swelling, or pain around your episiotomy or vaginal tear. ? More fluid or blood coming from your episiotomy or vaginal tear. ? Pus or a bad smell coming from your episiotomy or vaginal tear. ? A fever. ? A  rash. ? Little or no interest in activities you used to enjoy. ? Questions about caring for yourself or your baby.  Your episiotomy or vaginal tear feels warm to the touch.  Your episiotomy or vaginal tear is separating or does not appear to be healing.  Your breasts are painful, hard, or turn red.  You feel unusually sad or worried.  You feel nauseous or you vomit.  You pass large blood clots from your vagina. If you pass a blood clot from your vagina, save it to show to your health care provider. Do not flush blood clots down the toilet without having your health care provider look at them.  You urinate more than usual.  You are dizzy or light-headed.  You have not breastfed at all and you have not had a menstrual period for 12 weeks after delivery.  You have stopped breastfeeding and you have not had a menstrual period for 12 weeks after you stopped breastfeeding. Get help right away if:  You have: ? Pain that does not go away or does not get better with medicine. ? Chest pain. ? Difficulty breathing. ? Blurred vision or spots in your vision. ? Thoughts about hurting yourself or your baby.  You develop pain in your abdomen or in one of your legs.  You develop a severe headache.  You faint.  You bleed from your vagina so much that you fill two sanitary pads in one hour. This information is not intended to replace advice given to you by your health care provider. Make sure you discuss any questions you have with your health care provider. Document Released: 01/06/2000 Document Revised: 06/22/2015 Document Reviewed: 01/23/2015 Elsevier Interactive Patient Education  2018 [employer].  Call your doctor for increased pain or vaginal bleeding, temperature above 100.4, depression, or concerns.  Increase calories and fluids while breastfeeding.  Continue prenatal vitamin and iron.  No strenuous activity or heavy lifting for 6 weeks.  No intercourse, tampons, or douching for 6  weeks.  No tub baths- showers only.  No driving for 2 weeks.

## 2017-08-10 NOTE — Progress Notes (Signed)
Discharge instructions provided.  Pt and sig other verbalize understanding of all instructions and follow care.  Pt discharged to home with infant at 1308 on 08/10/17 via wheelchair by volunteer. Reynold BowenSusan Paisley Etienne Millward, RN 08/10/2017 2:50 PM

## 2017-09-10 ENCOUNTER — Telehealth: Payer: Self-pay | Admitting: Obstetrics & Gynecology

## 2017-09-10 NOTE — Telephone Encounter (Signed)
Paragard reserved for this patient. 

## 2017-09-10 NOTE — Telephone Encounter (Signed)
[facility]

## 2017-09-20 ENCOUNTER — Ambulatory Visit (INDEPENDENT_AMBULATORY_CARE_PROVIDER_SITE_OTHER): Payer: BC Managed Care – PPO | Admitting: Obstetrics & Gynecology

## 2017-09-20 ENCOUNTER — Encounter: Payer: Self-pay | Admitting: Obstetrics & Gynecology

## 2017-09-20 ENCOUNTER — Other Ambulatory Visit (HOSPITAL_COMMUNITY)
Admission: RE | Admit: 2017-09-20 | Discharge: 2017-09-20 | Disposition: A | Discharge status: Home / Self Care | Payer: BC Managed Care – PPO | Source: Ambulatory Visit | Attending: Obstetrics & Gynecology | Admitting: Obstetrics & Gynecology

## 2017-09-20 DIAGNOSIS — Z124 Encounter for screening for malignant neoplasm of cervix: Secondary | ICD-10-CM | POA: Insufficient documentation

## 2017-09-20 DIAGNOSIS — Z30430 Encounter for insertion of intrauterine contraceptive device: Secondary | ICD-10-CM

## 2017-09-20 MED ORDER — METOCLOPRAMIDE HCL 10 MG PO TABS: 10.0000 mg | ORAL_TABLET | Freq: Three times a day (TID) | ORAL | 11 refills | Status: DC

## 2017-09-20 NOTE — Patient Instructions (Signed)

## 2017-09-20 NOTE — Progress Notes (Signed)
  OBSTETRICS POSTPARTUM CLINIC PROGRESS NOTE  Subjective:     Amanda Allen is a 27 y.o. [id number] female who presents for a postpartum visit. She is 6 weeks postpartum following a Term pregnancy and delivery by Vaginal, no problems at delivery.  I have fully reviewed the prenatal and intrapartum course. Anesthesia: epidural.  Postpartum course has been complicated by uncomplicated.  Baby is feeding by Breast.  Bleeding: patient has not  resumed menses.  Bowel function is normal. Bladder function is normal.  Patient is not sexually active. Contraception method desired is IUD.  Postpartum depression screening: negative. [place] 0.  The following portions of the patient's history were reviewed and updated as appropriate: allergies, current medications, past family history, past medical history, past social history, past surgical history and problem list.  Review of Systems Pertinent items are noted in HPI.  Objective:    BP 100/70   Ht 5\' 4"  (1.626 m)   Wt 164 lb (74.4 kg)   BMI 28.15 kg/m   General:  alert and no distress   Breasts:  inspection negative, no nipple discharge or bleeding, no masses or nodularity palpable  Lungs: clear to auscultation bilaterally  Heart:  regular rate and rhythm, S1, S2 normal, no murmur, click, rub or gallop  Abdomen: soft, non-tender; bowel sounds normal; no masses,  no organomegaly.     Vulva:  normal  Vagina: normal vagina, no discharge, exudate, lesion, or erythema  Cervix:  no cervical motion tenderness and no lesions  Corpus: normal size, contour, position, consistency, mobility, non-tender  Adnexa:  normal adnexa and no mass, fullness, tenderness  Rectal Exam: Not performed.          Assessment:  Post Partum Care visit 1. Postpartum care following vaginal delivery  2. Encounter for insertion of copper IUD  3. Screening for cervical cancer - Cytology - PAP  Plan:  See orders and Patient Instructions Contraceptive counseling for  IUD Follow up in: 4 weeks or as needed.    IUD PROCEDURE NOTE:  Amanda Allen is a 27 y.o. [phone] [facility]. No GYN concerns.  Postpartum visit today.  IUD Insertion Procedure Note Patient identified, informed consent performed, consent signed.   Discussed risks of irregular bleeding, cramping, infection, malpositioning or misplacement of the IUD outside the uterus which may require further procedure such as laparoscopy, risk of failure <1%. Time out was performed.    A bimanual exam showed the uterus to be midposition.  Speculum placed in the vagina.  Cervix visualized.  Cleaned with Betadine x 2.  Grasped anteriorly with a single tooth tenaculum.  Uterus sounded to 7 cm.   IUD placed per manufacturer's recommendations.  Strings trimmed to 3 cm. Tenaculum was removed, good hemostasis noted.  Patient tolerated procedure well.   Patient was given post-procedure instructions.  She was advised to have backup contraception for one week.  Patient was also asked to check IUD strings periodically and follow up in 4 weeks for IUD check.  Amanda MajorPaul Quillan Whitter, MD, Merlinda FrederickFACOG [facility], [facility] 09/20/2017  4:07 PM

## 2017-09-25 LAB — CYTOLOGY - PAP: Diagnosis: NEGATIVE

## 2017-10-18 ENCOUNTER — Ambulatory Visit (INDEPENDENT_AMBULATORY_CARE_PROVIDER_SITE_OTHER): Payer: BC Managed Care – PPO | Admitting: Obstetrics & Gynecology

## 2017-10-18 ENCOUNTER — Encounter: Payer: Self-pay | Admitting: Obstetrics & Gynecology

## 2017-10-18 VITALS — BP 90/60 | Ht 64.0 in | Wt 162.0 lb

## 2017-10-18 DIAGNOSIS — Z30431 Encounter for routine checking of intrauterine contraceptive device: Secondary | ICD-10-CM | POA: Diagnosis not present

## 2017-10-18 NOTE — Progress Notes (Signed)
  History of Present Illness:  Amanda Allen is a 27 y.o. that had a Paragard IUD placed approximately 4 weeks ago. Since that time, she states that she has had no bleeding and pain  PMHx: She  has a past medical history of Dysmenorrhea, Headache, Irregular menses, Nipple discharge in female, Panic attack, and Pilonidal cyst. Also,  has a past surgical history that includes Pilonidal cyst excision; Tympanostomy tube placement; and laparoscopy (N/A, 01/31/2015)., family history includes Cancer in her maternal grandfather and paternal grandmother; Hyperlipidemia in her mother.,  reports that she has quit smoking. Her smoking use included cigarettes. She has a 0.50 pack-year smoking history. She has never used smokeless tobacco. She reports that she does not drink alcohol or use drugs. No outpatient medications have been marked as taking for the 10/18/17 encounter (Office Visit) with Nadara Mustard, MD.  .  Also, has No Known Allergies..  Review of Systems  All other systems reviewed and are negative.   Physical Exam:  BP 90/60   Ht 5\' 4"  (1.626 m)   Wt 162 lb (73.5 kg)   BMI 27.81 kg/m  Body mass index is 27.81 kg/m. Constitutional: Well nourished, well developed female in no acute distress.  Abdomen: diffusely non tender to palpation, non distended, and no masses, hernias Neuro: Grossly intact Psych:  Normal mood and affect.    Pelvic exam:  Two IUD strings present seen coming from the cervical os. EGBUS, vaginal vault and cervix: within normal limits  Assessment: IUD strings present in proper location; pt doing well  Plan: She was told to continue to use barrier contraception, in order to prevent any STIs, and to take a home pregnancy test or call us if she ever thinks she may be pregnant, and that her IUD expires in 10 years.  She was amenable to this plan and we will see her back in 1 year/PRN.  A total of 15 minutes were spent face-to-face with the patient during this encounter  and over half of that time dealt with counseling and coordination of care.  Annamarie Major, MD, [facility], [facility] 10/18/2017  4:07 PM

## 2017-11-18 ENCOUNTER — Encounter: Payer: Self-pay | Admitting: Obstetrics & Gynecology

## 2017-11-18 ENCOUNTER — Ambulatory Visit (INDEPENDENT_AMBULATORY_CARE_PROVIDER_SITE_OTHER): Payer: BC Managed Care – PPO | Admitting: Obstetrics & Gynecology

## 2017-11-18 VITALS — BP 100/60 | Ht 64.0 in | Wt 160.0 lb

## 2017-11-18 DIAGNOSIS — N61 Mastitis without abscess: Secondary | ICD-10-CM | POA: Diagnosis not present

## 2017-11-18 MED ORDER — FLUCONAZOLE 150 MG PO TABS: 150.0000 mg | ORAL_TABLET | Freq: Once | ORAL | 3 refills | Status: AC

## 2017-11-18 MED ORDER — CEPHALEXIN 500 MG PO CAPS: 500.0000 mg | ORAL_CAPSULE | Freq: Four times a day (QID) | ORAL | 2 refills | Status: DC

## 2017-11-18 NOTE — Progress Notes (Signed)
  HPI:      [name] is a 27 y.o. [id number] who is premenopausal, presents today for a problem visit.  She complains of breast tenderness, skin color change and also fever; on the rightside which she first noticed two days ago.  It has unchanged.  Associated symptoms include fatigue.  Denies nipple discharge or skin changes. Fever at home 104 on weekend and 101 today.  Breast feeding, PP 4 mos.  PMHx: She  has a past medical history of Dysmenorrhea, Headache, Irregular menses, Nipple discharge in female, Panic attack, and Pilonidal cyst. Also,  has a past surgical history that includes Pilonidal cyst excision; Tympanostomy tube placement; and laparoscopy (N/A, 01/31/2015)., family history includes Cancer in her maternal grandfather and paternal grandmother; Hyperlipidemia in her mother.,  reports that she has quit smoking. Her smoking use included cigarettes. She has a 0.50 pack-year smoking history. She has never used smokeless tobacco. She reports that she does not drink alcohol or use drugs.  She has a current medication list which includes the following prescription(s): ibuprofen, metoclopramide, prenatal multivitamin, and cephalexin. Also, has No Known Allergies.  Review of Systems  Constitutional: Negative for chills, fever and malaise/fatigue.  HENT: Negative for congestion, sinus pain and sore throat.   Eyes: Negative for blurred vision and pain.  Respiratory: Negative for cough and wheezing.   Cardiovascular: Negative for chest pain and leg swelling.  Gastrointestinal: Negative for abdominal pain, constipation, diarrhea, heartburn, nausea and vomiting.  Genitourinary: Negative for dysuria, frequency, hematuria and urgency.  Musculoskeletal: Negative for back pain, joint pain, myalgias and neck pain.  Skin: Negative for itching and rash.  Neurological: Negative for dizziness, tremors and weakness.  Endo/Heme/Allergies: Does not bruise/bleed easily.  Psychiatric/Behavioral: Negative  for depression. The patient is not nervous/anxious and does not have insomnia.     Objective: BP 100/60   Ht 5\' 4"  (1.626 m)   Wt 160 lb (72.6 kg)   BMI 27.46 kg/m  Physical Exam  Constitutional: She is oriented to person, place, and time. She appears well-developed and well-nourished. No distress.  Cardiovascular: Normal rate, regular rhythm, normal heart sounds and normal pulses. Exam reveals no gallop and no friction rub.  No murmur heard. Pulmonary/Chest: Effort normal and breath sounds normal. She exhibits no mass, no tenderness and no edema. Right breast exhibits skin change and tenderness. Right breast exhibits no inverted nipple, no mass and no nipple discharge. Left breast exhibits no inverted nipple, no mass, no nipple discharge, no skin change and no tenderness. No breast swelling or tenderness.  Slight erythema to UIQ right breast w T to touch, no mass  Abdominal: Normal appearance.  Musculoskeletal: Normal range of motion.  Lymphadenopathy:    She has no axillary adenopathy.       Right axillary: No pectoral and no lateral adenopathy present.       Left axillary: No pectoral and no lateral adenopathy present. Neurological: She is alert and oriented to person, place, and time.  Skin: Skin is warm and dry. No abrasion, no bruising, no lesion and no rash noted. No erythema.  Psychiatric: She has a normal mood and affect. Her speech is normal and behavior is normal. Judgment normal.  Vitals reviewed.  ASSESSMENT/PLAN: Pain and fever 1. Acute mastitis of right breast Keflex, heat, Tylenol  Annamarie Major, MD, [facility], [facility] 11/18/2017  10:47 AM

## 2017-11-18 NOTE — Patient Instructions (Signed)
Mastitis  Mastitis is inflammation of the breast tissue. It occurs most often in women who are breastfeeding, but it can also affect other women, and even sometimes men.  What are the causes?  Mastitis is usually caused by a bacterial infection. Bacteria enter the breast tissue through cuts or openings in the skin. Typically, this occurs with breastfeeding because of cracked or irritated skin. Sometimes, it can occur even when there is no opening in the skin. It can be associated with plugged milk (lactiferous) ducts. Nipple piercing can also lead to mastitis. Also, some forms of breast cancer can cause mastitis.  What are the signs or symptoms?  · Swelling, redness, tenderness, and pain in an area of the breast.  · Swelling of the glands under the arm on the same side.  · Fever.  If an infection is allowed to progress, a collection of pus (abscess) may develop.  How is this diagnosed?  Your health care provider can usually diagnose mastitis based on your symptoms and a physical exam. Tests may be done to help confirm the diagnosis. These may include:  · Removal of pus from the breast by applying pressure to the area. This pus can be examined in the lab to determine which bacteria are present. If an abscess has developed, the fluid in the abscess can be removed with a needle. This can also be used to confirm the diagnosis and determine the bacteria present. In most cases, pus will not be present.  · Blood tests to determine if your body is fighting a bacterial infection.  · Mammogram or ultrasound tests to rule out other problems or diseases.    How is this treated?  Antibiotic medicine is used to treat a bacterial infection. Your health care provider will determine which bacteria are most likely causing the infection and will select an appropriate antibiotic. This is sometimes changed based on the results of tests performed to identify the bacteria, or if there is no response to the antibiotic selected. Antibiotics  are usually given by mouth. You may also be given medicine for pain.  Mastitis that occurs with breastfeeding will sometimes go away on its own, so your health care provider may choose to wait 24 hours after first seeing you to decide whether a prescription medicine is needed.  Follow these instructions at home:  · Only take over-the-counter or prescription medicines for pain, fever, or discomfort as directed by your health care provider.  · If your health care provider prescribed an antibiotic, take the medicine as directed. Make sure you finish it even if you start to feel better.  · Do not wear a tight or underwire bra. Wear a soft, supportive bra.  · Increase your fluid intake, especially if you have a fever.  · Women who are breastfeeding should follow these instructions:  ? Continue to empty the breast. Your health care provider can tell you whether this milk is safe for your infant or needs to be thrown out. You may be told to stop nursing until your health care provider thinks it is safe for your baby. Use a breast pump if you are advised to stop nursing.  ? Keep your nipples clean and dry.  ? Empty the first breast completely before going to the other breast. If your baby is not emptying your breasts completely for some reason, use a breast pump to empty your breasts.  ? If you go back to work, pump your breasts while at work to stay   in time with your nursing schedule.  ? Avoid allowing your breasts to become overly filled with milk (engorged).  Contact a health care provider if:  · You have pus-like discharge from the breast.  · Your symptoms do not improve with the treatment prescribed by your health care provider within 2 days.  Get help right away if:  · Your pain and swelling are getting worse.  · You have pain that is not controlled with medicine.  · You have a red line extending from the breast toward your armpit.  · You have a fever or persistent symptoms for more than 2-3 days.  · You have a fever  and your symptoms suddenly get worse.  This information is not intended to replace advice given to you by your health care provider. Make sure you discuss any questions you have with your health care provider.  Document Released: 01/08/2005 Document Revised: 06/16/2015 Document Reviewed: 08/08/2012  Elsevier Interactive Patient Education © 2017 [employer]

## 2018-01-20 ENCOUNTER — Encounter: Payer: Self-pay | Admitting: Family Medicine

## 2018-01-20 ENCOUNTER — Ambulatory Visit: Payer: BC Managed Care – PPO | Admitting: Family Medicine

## 2018-01-20 VITALS — BP 90/60 | HR 64 | Ht 64.0 in | Wt 162.0 lb

## 2018-01-20 DIAGNOSIS — J0100 Acute maxillary sinusitis, unspecified: Secondary | ICD-10-CM

## 2018-01-20 MED ORDER — AMOXICILLIN 500 MG PO CAPS: 500.0000 mg | ORAL_CAPSULE | Freq: Three times a day (TID) | ORAL | 0 refills | Status: DC

## 2018-01-20 NOTE — Progress Notes (Signed)
Date:  01/20/2018   Name:  Amanda Allen   DOB:  [birth year]   MRN:  [id number]   Chief Complaint: Sinusitis (congestion, earache- hard hearing, no drainage)  Sinusitis  This is a new problem. The current episode started in the past 7 days (Christmas day). The problem has been gradually worsening since onset. There has been no fever. Her pain is at a severity of 3/10. The pain is moderate. Associated symptoms include congestion, coughing and ear pain. Pertinent negatives include no chills, diaphoresis, headaches, hoarse voice, neck pain, shortness of breath, sinus pressure, sneezing, sore throat or swollen glands. (Yellow-green nasal drainage) Past treatments include acetaminophen. The treatment provided mild relief.    Review of Systems  Constitutional: Negative.  Negative for chills, diaphoresis, fatigue, fever and unexpected weight change.  HENT: Positive for congestion and ear pain. Negative for ear discharge, hoarse voice, rhinorrhea, sinus pressure, sneezing and sore throat.   Eyes: Negative for photophobia, pain, discharge, redness and itching.  Respiratory: Positive for cough. Negative for shortness of breath, wheezing and stridor.   Gastrointestinal: Negative for abdominal pain, blood in stool, constipation, diarrhea, nausea and vomiting.  Endocrine: Negative for cold intolerance, heat intolerance, polydipsia, polyphagia and polyuria.  Genitourinary: Negative for dysuria, flank pain, frequency, hematuria, menstrual problem, pelvic pain, urgency, vaginal bleeding and vaginal discharge.  Musculoskeletal: Negative for arthralgias, back pain, myalgias and neck pain.  Skin: Negative for rash.  Allergic/Immunologic: Negative for environmental allergies and food allergies.  Neurological: Negative for dizziness, weakness, light-headedness, numbness and headaches.  Hematological: Negative for adenopathy. Does not bruise/bleed easily.  Psychiatric/Behavioral: Negative for dysphoric mood.  The patient is not nervous/anxious.     Patient Active Problem List   Diagnosis Date Noted  . Postpartum care following vaginal delivery 08/10/2017  . Post-dates pregnancy 08/08/2017  . Born by normal vaginal delivery 08/08/2017  . Rh negative state in antepartum period 07/22/2017  . Supervision of other normal pregnancy, antepartum 12/12/2016  . Pelvic peritoneal adhesions, female 01/31/2015    No Known Allergies  Past Surgical History:  Procedure Laterality Date  . LAPAROSCOPY N/A 01/31/2015   Procedure: LAPAROSCOPY DIAGNOSTIC WITH PERITONEAL BIOPSIES; LYSIS OF ADHESIONS;  Surgeon: Herold HarmsMartin A Defrancesco, MD;  Location: [facility] ORS;  Service: Gynecology;  Laterality: N/A;  . PILONIDAL CYST EXCISION    . TYMPANOSTOMY TUBE PLACEMENT      Social History   Tobacco Use  . Smoking status: Former Smoker    Packs/day: 0.25    Years: 2.00    Pack years: 0.50    Types: Cigarettes  . Smokeless tobacco: Never Used  Substance Use Topics  . Alcohol use: No    Alcohol/week: 0.0 standard drinks  . Drug use: No     Medication list has been reviewed and updated.  Current Meds  Medication Sig  . metoCLOPramide (REGLAN) 10 MG tablet Take 1 tablet (10 mg total) by mouth 3 (three) times daily before meals.    PHQ 2/9 Scores 01/20/2018 01/02/2017 02/17/2015  PHQ - 2 Score 0 0 0  PHQ- 9 Score 0 - -    Physical Exam Vitals signs and nursing note reviewed.  Constitutional:      General: She is not in acute distress.    Appearance: She is not diaphoretic.  HENT:     Head: Normocephalic and atraumatic.     Right Ear: External ear normal. Tympanic membrane is retracted.     Left Ear: External ear normal. Tympanic membrane is retracted.  Nose: Congestion and rhinorrhea present.     Right Sinus: Maxillary sinus tenderness present. No frontal sinus tenderness.     Left Sinus: Maxillary sinus tenderness present. No frontal sinus tenderness.     Mouth/Throat:     Mouth: Mucous membranes  are moist.     Tonsils: No tonsillar exudate.  Eyes:     General:        Right eye: No discharge.        Left eye: No discharge.     Conjunctiva/sclera: Conjunctivae normal.     Pupils: Pupils are equal, round, and reactive to light.  Neck:     Musculoskeletal: Normal range of motion and neck supple.     Thyroid: No thyromegaly.     Vascular: No JVD.  Cardiovascular:     Rate and Rhythm: Normal rate and regular rhythm.     Heart sounds: Normal heart sounds. No murmur. No friction rub. No gallop.   Pulmonary:     Effort: Pulmonary effort is normal.     Breath sounds: Normal breath sounds. No decreased breath sounds, wheezing, rhonchi or rales.  Abdominal:     General: Bowel sounds are normal.     Palpations: Abdomen is soft. There is no mass.     Tenderness: There is no abdominal tenderness. There is no guarding.  Musculoskeletal: Normal range of motion.  Lymphadenopathy:     Head:     Right side of head: Submandibular adenopathy present.     Left side of head: Submandibular adenopathy present.     Cervical: No cervical adenopathy.  Skin:    General: Skin is warm and dry.  Neurological:     Mental Status: She is alert.     Deep Tendon Reflexes: Reflexes are normal and symmetric.     BP 90/60   Pulse 64   Ht 5\' 4"  (1.626 m)   Wt 162 lb (73.5 kg)   Breastfeeding Yes   BMI 27.81 kg/m   Assessment and Plan: 1. Acute maxillary sinusitis, recurrence not specified Acute. Start amoxil tid- return if not better - amoxicillin (AMOXIL) 500 MG capsule; Take 1 capsule (500 mg total) by mouth 3 (three) times daily.  Dispense: 30 capsule; Refill: 0

## 2018-02-17 ENCOUNTER — Telehealth: Payer: Self-pay

## 2018-02-17 NOTE — Telephone Encounter (Signed)
This is all we know that can help.

## 2018-02-17 NOTE — Telephone Encounter (Signed)
PH had rx'd her reglan to increase her breast milk.  Wants to know if there is anything else she can take b/c reglan makes her feel exhausted, can't keep eyes open.  [phone]

## 2018-02-20 NOTE — Telephone Encounter (Signed)
Pt aware reglan is the only med to increase breast milk.  Suggested she call the lactation consultant # given.

## 2018-12-24 ENCOUNTER — Other Ambulatory Visit: Payer: Self-pay

## 2018-12-24 DIAGNOSIS — Z20822 Contact with and (suspected) exposure to covid-19: Secondary | ICD-10-CM

## 2018-12-27 LAB — NOVEL CORONAVIRUS, NAA: SARS-CoV-2, NAA: NOT DETECTED

## 2019-03-10 NOTE — Telephone Encounter (Signed)
Paragard rcvd/charged 09/20/17

## 2020-03-17 ENCOUNTER — Telehealth: Payer: Self-pay

## 2020-03-17 NOTE — Telephone Encounter (Signed)
We do not have anything on file except a TDAP. Daisey checked the [facility] as well and no immunizations there. Pt has been advised to call health dept to see if they can pull something up

## 2020-03-17 NOTE — Telephone Encounter (Unsigned)
Copied from CRM [id number]. Topic: General - Inquiry >> Mar 17, 2020 11:19 AM Crist Infante wrote: Reason for CRM: pt states her immunization record is not on her mychart.  Pt needs for a new job at [employer]. Pt would like to pick this up tomorrow.  Also could the nurse release the immunizations to [facility] If possible. >> Mar 17, 2020  3:05 PM Leafy Ro wrote: Pt immunization maybe in [facility] registry >> Mar 17, 2020  3:02 PM Leafy Ro wrote: Pt is calling back and stated she was seeing dr Yetta Barre since she was about 30 years old and would like to pick up immunization records

## 2020-03-28 ENCOUNTER — Ambulatory Visit (LOCAL_COMMUNITY_HEALTH_CENTER): Payer: Self-pay

## 2020-03-28 ENCOUNTER — Other Ambulatory Visit: Payer: Self-pay

## 2020-03-28 DIAGNOSIS — Z111 Encounter for screening for respiratory tuberculosis: Secondary | ICD-10-CM

## 2020-03-29 ENCOUNTER — Telehealth: Payer: Self-pay | Admitting: Obstetrics and Gynecology

## 2020-03-29 NOTE — Telephone Encounter (Signed)
Pt called requesting record from 2012- I advised her to reach out to Medical Records and she can not get anyone to response. Pt states that she was a pt of Dr. Carlynn Spry and needs records for employment, I believe she is talking about immunizations. Pt also mentioned that health department did not have anything on file during that time either. Please Advise.

## 2020-03-30 NOTE — Telephone Encounter (Signed)
LMTRC

## 2020-03-31 ENCOUNTER — Other Ambulatory Visit: Payer: Self-pay

## 2020-03-31 ENCOUNTER — Telehealth: Payer: Self-pay | Admitting: Obstetrics and Gynecology

## 2020-03-31 ENCOUNTER — Ambulatory Visit (LOCAL_COMMUNITY_HEALTH_CENTER): Payer: Self-pay

## 2020-03-31 DIAGNOSIS — Z111 Encounter for screening for respiratory tuberculosis: Secondary | ICD-10-CM

## 2020-03-31 LAB — TB SKIN TEST
Induration: 0 mm
TB Skin Test: NEGATIVE

## 2020-03-31 NOTE — Telephone Encounter (Signed)
See previous telephone encounter.

## 2020-03-31 NOTE — Telephone Encounter (Signed)
Pt returning your call from yesterday.

## 2020-03-31 NOTE — Telephone Encounter (Signed)
Pt is requesting a varicella  injection  From 2012- that she received at [facility] while seeing [facility].   Pt advised to contact [facility].

## 2021-03-21 ENCOUNTER — Telehealth: Payer: Self-pay | Admitting: Family Medicine

## 2021-11-17 ENCOUNTER — Encounter: Payer: Self-pay | Admitting: Family

## 2021-11-17 ENCOUNTER — Ambulatory Visit: Payer: 59 | Admitting: Family

## 2021-11-17 VITALS — BP 112/70 | HR 94 | Temp 98.2°F | Resp 16 | Ht 64.0 in | Wt 168.6 lb

## 2021-11-17 DIAGNOSIS — Z6828 Body mass index (BMI) 28.0-28.9, adult: Secondary | ICD-10-CM

## 2021-11-17 DIAGNOSIS — E663 Overweight: Secondary | ICD-10-CM

## 2021-11-17 DIAGNOSIS — Z7689 Persons encountering health services in other specified circumstances: Secondary | ICD-10-CM

## 2021-11-17 DIAGNOSIS — F439 Reaction to severe stress, unspecified: Secondary | ICD-10-CM

## 2021-11-17 NOTE — Progress Notes (Signed)
Provider: Marlowe Sax FNP-C   Khushboo Chuck, Nelda Bucks, NP  Patient Care Team: Kizzi Overbey, Nelda Bucks, NP as PCP - General (Family Medicine)  Extended Emergency Contact Information Primary Emergency Contact: Fiumara,Jared Address: [address]          [place], [place] [address] [place] Phone: [phone] Relation: Significant other  Code Status:  Full Code  Goals of care: Advanced Directive information    11/17/2021    1:11 PM  Advanced Directives  Does Patient Have a Medical Advance Directive? No  Would patient like information on creating a medical advance directive? No - Patient declined     Chief Complaint  Patient presents with   Establish Care    New Patient.    HPI:  Pt is a 31 y.o. female seen today establish care here at [facility].Has chronic medical history of Panic attacks , Pilonidal cyst no current flare up,migraines among others.Has not seen PCP in several years due to no insurances. She does not take any medication or supplements. States currently seeing a Audiological scientist for increase stress level due to recent marital problems.Has  children ages 43 yr and 4 yr who is present during visit. She request FMLA paper work filled 2-3 times per week to attend therapy and also take her children for medical visit.  Her last LMP was 10/09/2021 has Paragard IUD follows up with [facility]       Past Medical History:  Diagnosis Date   Dysmenorrhea    Headache    migraines   Irregular menses    Nipple discharge in female    occasional   Panic attack    Pilonidal cyst    Past Surgical History:  Procedure Laterality Date   LAPAROSCOPY N/A 01/31/2015   Procedure: LAPAROSCOPY DIAGNOSTIC WITH PERITONEAL BIOPSIES; LYSIS OF ADHESIONS;  Surgeon: Brayton Mars, MD;  Location: [facility] ORS;  Service: Gynecology;  Laterality: N/A;   PILONIDAL CYST EXCISION     TYMPANOSTOMY TUBE PLACEMENT      No Known  Allergies  Allergies as of 11/17/2021   No Known Allergies      Medication List        Accurate as of November 17, 2021  1:13 PM. If you have any questions, ask your nurse or doctor.          STOP taking these medications    amoxicillin 500 MG capsule Commonly known as: AMOXIL Stopped by: Sandrea Hughs, NP   metoCLOPramide 10 MG tablet Commonly known as: REGLAN Stopped by: Sandrea Hughs, NP        Review of Systems  Constitutional:  Negative for appetite change, chills, fatigue, fever and unexpected weight change.  HENT:  Negative for congestion, dental problem, ear discharge, ear pain, facial swelling, hearing loss, nosebleeds, postnasal drip, rhinorrhea, sinus pressure, sinus pain, sneezing, sore throat, tinnitus and trouble swallowing.   Eyes:  Negative for pain, discharge, redness, itching and visual disturbance.  Respiratory:  Negative for cough, chest tightness, shortness of breath and wheezing.   Cardiovascular:  Negative for chest pain, palpitations and leg swelling.  Gastrointestinal:  Negative for abdominal distention, abdominal pain, blood in stool, constipation, diarrhea, nausea and vomiting.  Endocrine: Negative for cold intolerance, heat intolerance, polydipsia, polyphagia and polyuria.  Genitourinary:  Negative for difficulty urinating, dysuria, flank pain, frequency and urgency.  Musculoskeletal:  Negative for arthralgias, back pain, gait problem, joint swelling, myalgias, neck  pain and neck stiffness.  Skin:  Negative for color change, pallor, rash and wound.  Neurological:  Negative for dizziness, syncope, speech difficulty, weakness, light-headedness, numbness and headaches.  Hematological:  Does not bruise/bleed easily.  Psychiatric/Behavioral:  Negative for agitation, behavioral problems, confusion, hallucinations, self-injury, sleep disturbance and suicidal ideas. The patient is not nervous/anxious.     Immunization History  Administered  Date(s) Administered   Influenza,inj,Quad PF,6+ Mos 03/18/2020   Influenza-Unspecified 10/26/2021   PPD Test 03/28/2020   Tdap 06/05/2017   Pertinent  Health Maintenance Due  Topic Date Due   PAP SMEAR-Modifier  09/20/2020   INFLUENZA VACCINE  Completed      02/17/2015   11:35 AM 11/17/2021    1:11 PM  Fall Risk  Falls in the past year? No 0  Was there an injury with Fall?  0  Fall Risk Category Calculator  0  Fall Risk Category  Low  Patient Fall Risk Level  Low fall risk  Patient at Risk for Falls Due to  No Fall Risks  Fall risk Follow up  Falls evaluation completed   Functional Status Survey:    Vitals:   11/17/21 1308  BP: 112/70  Pulse: 94  Resp: 16  Temp: 98.2 F (36.8 C)  SpO2: 99%  Weight: 168 lb 9.6 oz (76.5 kg)  Height: _0  (1.626 m)   Body mass index is 28.94 kg/m. Physical Exam Vitals reviewed.  Constitutional:      General: She is not in acute distress.    Appearance: Normal appearance. She is overweight. She is not ill-appearing or diaphoretic.  HENT:     Head: Normocephalic.     Right Ear: Tympanic membrane, ear canal and external ear normal. There is no impacted cerumen.     Left Ear: Tympanic membrane, ear canal and external ear normal. There is no impacted cerumen.     Nose: Nose normal. No congestion or rhinorrhea.     Mouth/Throat:     Mouth: Mucous membranes are moist.     Pharynx: Oropharynx is clear. No oropharyngeal exudate or posterior oropharyngeal erythema.  Eyes:     General: No scleral icterus.       Right eye: No discharge.        Left eye: No discharge.     Extraocular Movements: Extraocular movements intact.     Conjunctiva/sclera: Conjunctivae normal.     Pupils: Pupils are equal, round, and reactive to light.  Neck:     Vascular: No carotid bruit.  Cardiovascular:     Rate and Rhythm: Normal rate and regular rhythm.     Pulses: Normal pulses.     Heart sounds: Normal heart sounds. No murmur heard.    No friction  rub. No gallop.  Pulmonary:     Effort: Pulmonary effort is normal. No respiratory distress.     Breath sounds: Normal breath sounds. No wheezing, rhonchi or rales.  Chest:     Chest wall: No tenderness.  Abdominal:     General: Bowel sounds are normal. There is no distension.     Palpations: Abdomen is soft. There is no mass.     Tenderness: There is no abdominal tenderness. There is no right CVA tenderness, left CVA tenderness, guarding or rebound.  Musculoskeletal:        General: No swelling or tenderness. Normal range of motion.     Cervical back: Normal range of motion. No rigidity or tenderness.     Right lower leg: No  edema.     Left lower leg: No edema.  Lymphadenopathy:     Cervical: No cervical adenopathy.  Skin:    General: Skin is warm and dry.     Coloration: Skin is not pale.     Findings: No bruising, erythema, lesion or rash.  Neurological:     Mental Status: She is alert and oriented to person, place, and time.     Cranial Nerves: No cranial nerve deficit.     Sensory: No sensory deficit.     Motor: No weakness.     Coordination: Coordination normal.     Gait: Gait normal.  Psychiatric:        Mood and Affect: Mood normal.        Speech: Speech normal.        Behavior: Behavior normal.        Thought Content: Thought content normal.        Judgment: Judgment normal.     Labs reviewed: No results for input(s): "NA", "K", "CL", "CO2", "GLUCOSE", "BUN", "CREATININE", "CALCIUM", "MG", "PHOS" in the last 8760 hours. No results for input(s): "AST", "ALT", "ALKPHOS", "BILITOT", "PROT", "ALBUMIN" in the last 8760 hours. No results for input(s): "WBC", "NEUTROABS", "HGB", "HCT", "MCV", "PLT" in the last 8760 hours. No results found for: "TSH" No results found for: "HGBA1C" No results found for: "CHOL", "HDL", "LDLCALC", "LDLDIRECT", "TRIG", "CHOLHDL"  Significant Diagnostic Results in last 30 days:  No results found.  Assessment/Plan 1. Encounter to establish  care Immunization reviewed up to date except due for COVID-19 booster vaccine.Advised to get vaccine a the Pharmacy. Advised to get Pap smear done at her OB/GYN.Recommended fasting labs.  2. Overweight with body mass index (BMI) 25.0-29.9 BMI 28.94 - dietary modification and exercise advised  - CBC with Differential/Platelet; Future - CMP with eGFR([facility]); Future  3. Body mass index 28.0-28.9, adult BMI 28.94 no associated co-morbidities. - CBC with Differential/Platelet; Future - CMP with eGFR([facility]); Future  4. Stress at home Did not mention exactly ongoing issues at home but states currently seeing a Therapist through work program. Denies symptoms of depression  - continue to follow up with Therapist  FMLA forms to be completed    Family/ staff Communication: Reviewed plan of care with patient verbalized understanding   Labs/tests ordered:  - CBC with Differential/Platelet - CMP with eGFR([facility])  Next Appointment : Return in about 1 year (around 11/18/2022) for annual Physical examination.   Sandrea Hughs, NP

## 2021-11-26 DIAGNOSIS — E663 Overweight: Secondary | ICD-10-CM | POA: Insufficient documentation

## 2021-11-30 ENCOUNTER — Telehealth: Payer: Self-pay | Admitting: Family

## 2021-11-30 NOTE — Telephone Encounter (Signed)
Leave of absence forms completed patient to pick them up or fax if requested.

## 2021-11-30 NOTE — Telephone Encounter (Signed)
Patient returned call and stated Amanda Allen with the Admin staff had told her that she will email her the completed form.  I verified email address as [email or web address]  Form was given to [facility] to further process.

## 2021-11-30 NOTE — Telephone Encounter (Signed)
Left voicemail for Patient to return call, reason for the call was ask how would the patient  like form to be processed.  Side note: If patient would like to fax form we need a fax number

## 2022-11-19 ENCOUNTER — Ambulatory Visit: Payer: BC Managed Care – PPO | Admitting: Family
